# Patient Record
Sex: Male | Born: 1955 | Race: White | Hispanic: No | Marital: Married | Smoking: Former smoker
Health system: Southern US, Community
[De-identification: ages and names within clinical notes are randomized; demographics above are authoritative.]

## PROBLEM LIST (undated history)

## (undated) DIAGNOSIS — C61 Malignant neoplasm of prostate: Secondary | ICD-10-CM

## (undated) DIAGNOSIS — M1990 Unspecified osteoarthritis, unspecified site: Secondary | ICD-10-CM

## (undated) DIAGNOSIS — I10 Essential (primary) hypertension: Secondary | ICD-10-CM

## (undated) DIAGNOSIS — R519 Headache, unspecified: Secondary | ICD-10-CM

## (undated) DIAGNOSIS — E785 Hyperlipidemia, unspecified: Secondary | ICD-10-CM

## (undated) DIAGNOSIS — R51 Headache: Secondary | ICD-10-CM

## (undated) DIAGNOSIS — G4700 Insomnia, unspecified: Secondary | ICD-10-CM

## (undated) HISTORY — PX: EYE SURGERY: SHX253

## (undated) HISTORY — PX: MOUTH SURGERY: SHX715

## (undated) HISTORY — PX: VASECTOMY: SHX75

## (undated) HISTORY — DX: Insomnia, unspecified: G47.00

## (undated) HISTORY — DX: Hyperlipidemia, unspecified: E78.5

## (undated) HISTORY — PX: OTHER SURGICAL HISTORY: SHX169

## (undated) HISTORY — PX: COLONOSCOPY: SHX174

---

## 2008-07-26 ENCOUNTER — Emergency Department: Payer: Self-pay | Admitting: Emergency Medicine

## 2008-07-26 ENCOUNTER — Other Ambulatory Visit: Payer: Self-pay

## 2013-12-12 ENCOUNTER — Ambulatory Visit: Payer: Self-pay | Admitting: Radiation Oncology

## 2014-01-04 ENCOUNTER — Ambulatory Visit: Payer: Self-pay | Admitting: Radiation Oncology

## 2014-01-08 LAB — CBC CANCER CENTER
BASOS PCT: 0.7 %
Basophil #: 0.1 x10 3/mm (ref 0.0–0.1)
Eosinophil #: 0.2 x10 3/mm (ref 0.0–0.7)
Eosinophil %: 2.3 %
HCT: 45.2 % (ref 40.0–52.0)
HGB: 14.7 g/dL (ref 13.0–18.0)
Lymphocyte #: 1.8 x10 3/mm (ref 1.0–3.6)
Lymphocyte %: 21.6 %
MCH: 28.8 pg (ref 26.0–34.0)
MCHC: 32.6 g/dL (ref 32.0–36.0)
MCV: 88 fL (ref 80–100)
MONO ABS: 0.8 x10 3/mm (ref 0.2–1.0)
MONOS PCT: 9.3 %
NEUTROS ABS: 5.6 x10 3/mm (ref 1.4–6.5)
Neutrophil %: 66.1 %
Platelet: 169 x10 3/mm (ref 150–440)
RBC: 5.12 10*6/uL (ref 4.40–5.90)
RDW: 12.6 % (ref 11.5–14.5)
WBC: 8.5 x10 3/mm (ref 3.8–10.6)

## 2014-01-15 LAB — CBC CANCER CENTER
Basophil #: 0 x10 3/mm (ref 0.0–0.1)
Basophil %: 0.4 %
Eosinophil #: 0.3 x10 3/mm (ref 0.0–0.7)
Eosinophil %: 3.1 %
HCT: 43.9 % (ref 40.0–52.0)
HGB: 14.4 g/dL (ref 13.0–18.0)
Lymphocyte #: 1.6 x10 3/mm (ref 1.0–3.6)
Lymphocyte %: 19.6 %
MCH: 28.7 pg (ref 26.0–34.0)
MCHC: 32.7 g/dL (ref 32.0–36.0)
MCV: 88 fL (ref 80–100)
MONO ABS: 0.9 x10 3/mm (ref 0.2–1.0)
Monocyte %: 10.9 %
NEUTROS PCT: 66 %
Neutrophil #: 5.4 x10 3/mm (ref 1.4–6.5)
PLATELETS: 181 x10 3/mm (ref 150–440)
RBC: 5.01 10*6/uL (ref 4.40–5.90)
RDW: 12.5 % (ref 11.5–14.5)
WBC: 8.2 x10 3/mm (ref 3.8–10.6)

## 2014-01-22 LAB — CBC CANCER CENTER
Basophil #: 0 x10 3/mm (ref 0.0–0.1)
Basophil %: 0.5 %
EOS ABS: 0.3 x10 3/mm (ref 0.0–0.7)
Eosinophil %: 3.7 %
HCT: 43.6 % (ref 40.0–52.0)
HGB: 14.3 g/dL (ref 13.0–18.0)
LYMPHS ABS: 1.6 x10 3/mm (ref 1.0–3.6)
Lymphocyte %: 19.5 %
MCH: 28.9 pg (ref 26.0–34.0)
MCHC: 32.8 g/dL (ref 32.0–36.0)
MCV: 88 fL (ref 80–100)
MONO ABS: 0.9 x10 3/mm (ref 0.2–1.0)
Monocyte %: 10.7 %
NEUTROS ABS: 5.3 x10 3/mm (ref 1.4–6.5)
NEUTROS PCT: 65.6 %
PLATELETS: 176 x10 3/mm (ref 150–440)
RBC: 4.95 10*6/uL (ref 4.40–5.90)
RDW: 12.3 % (ref 11.5–14.5)
WBC: 8.1 x10 3/mm (ref 3.8–10.6)

## 2014-01-29 LAB — CBC CANCER CENTER
BASOS ABS: 0 x10 3/mm (ref 0.0–0.1)
Basophil %: 0.5 %
EOS ABS: 0.3 x10 3/mm (ref 0.0–0.7)
Eosinophil %: 3.3 %
HCT: 44.9 % (ref 40.0–52.0)
HGB: 14.6 g/dL (ref 13.0–18.0)
LYMPHS PCT: 17.3 %
Lymphocyte #: 1.4 x10 3/mm (ref 1.0–3.6)
MCH: 28.5 pg (ref 26.0–34.0)
MCHC: 32.5 g/dL (ref 32.0–36.0)
MCV: 88 fL (ref 80–100)
Monocyte #: 0.9 x10 3/mm (ref 0.2–1.0)
Monocyte %: 10.7 %
Neutrophil #: 5.4 x10 3/mm (ref 1.4–6.5)
Neutrophil %: 68.2 %
Platelet: 172 x10 3/mm (ref 150–440)
RBC: 5.12 10*6/uL (ref 4.40–5.90)
RDW: 12.5 % (ref 11.5–14.5)
WBC: 7.9 x10 3/mm (ref 3.8–10.6)

## 2014-02-04 ENCOUNTER — Ambulatory Visit: Payer: Self-pay | Admitting: Radiation Oncology

## 2014-02-05 LAB — CBC CANCER CENTER
Basophil #: 0 x10 3/mm (ref 0.0–0.1)
Basophil %: 0.5 %
EOS ABS: 0.3 x10 3/mm (ref 0.0–0.7)
Eosinophil %: 3.5 %
HCT: 43.6 % (ref 40.0–52.0)
HGB: 14.5 g/dL (ref 13.0–18.0)
LYMPHS ABS: 1.2 x10 3/mm (ref 1.0–3.6)
Lymphocyte %: 15.6 %
MCH: 29.3 pg (ref 26.0–34.0)
MCHC: 33.2 g/dL (ref 32.0–36.0)
MCV: 88 fL (ref 80–100)
Monocyte #: 0.9 x10 3/mm (ref 0.2–1.0)
Monocyte %: 12.6 %
NEUTROS PCT: 67.8 %
Neutrophil #: 5.1 x10 3/mm (ref 1.4–6.5)
Platelet: 170 x10 3/mm (ref 150–440)
RBC: 4.95 10*6/uL (ref 4.40–5.90)
RDW: 12.5 % (ref 11.5–14.5)
WBC: 7.6 x10 3/mm (ref 3.8–10.6)

## 2014-02-12 LAB — CBC CANCER CENTER
Basophil #: 0.1 x10 3/mm (ref 0.0–0.1)
Basophil %: 0.8 %
EOS PCT: 3 %
Eosinophil #: 0.2 x10 3/mm (ref 0.0–0.7)
HCT: 45.1 % (ref 40.0–52.0)
HGB: 14.7 g/dL (ref 13.0–18.0)
LYMPHS ABS: 1 x10 3/mm (ref 1.0–3.6)
Lymphocyte %: 13.8 %
MCH: 29 pg (ref 26.0–34.0)
MCHC: 32.6 g/dL (ref 32.0–36.0)
MCV: 89 fL (ref 80–100)
Monocyte #: 0.9 x10 3/mm (ref 0.2–1.0)
Monocyte %: 12.1 %
Neutrophil #: 5.2 x10 3/mm (ref 1.4–6.5)
Neutrophil %: 70.3 %
Platelet: 171 x10 3/mm (ref 150–440)
RBC: 5.08 10*6/uL (ref 4.40–5.90)
RDW: 12.9 % (ref 11.5–14.5)
WBC: 7.4 x10 3/mm (ref 3.8–10.6)

## 2014-02-19 LAB — CBC CANCER CENTER
Basophil #: 0 x10 3/mm (ref 0.0–0.1)
Basophil %: 0.6 %
EOS PCT: 2.4 %
Eosinophil #: 0.2 x10 3/mm (ref 0.0–0.7)
HCT: 46.1 % (ref 40.0–52.0)
HGB: 14.9 g/dL (ref 13.0–18.0)
LYMPHS ABS: 1.1 x10 3/mm (ref 1.0–3.6)
Lymphocyte %: 12.4 %
MCH: 28.9 pg (ref 26.0–34.0)
MCHC: 32.4 g/dL (ref 32.0–36.0)
MCV: 89 fL (ref 80–100)
Monocyte #: 1.1 x10 3/mm — ABNORMAL HIGH (ref 0.2–1.0)
Monocyte %: 12.7 %
NEUTROS ABS: 6.3 x10 3/mm (ref 1.4–6.5)
Neutrophil %: 71.9 %
PLATELETS: 172 x10 3/mm (ref 150–440)
RBC: 5.17 10*6/uL (ref 4.40–5.90)
RDW: 13.3 % (ref 11.5–14.5)
WBC: 8.7 x10 3/mm (ref 3.8–10.6)

## 2014-03-06 ENCOUNTER — Ambulatory Visit: Payer: Self-pay | Admitting: Radiation Oncology

## 2014-08-04 ENCOUNTER — Ambulatory Visit: Payer: Self-pay | Admitting: Radiation Oncology

## 2014-08-06 ENCOUNTER — Ambulatory Visit: Payer: Self-pay | Admitting: Radiation Oncology

## 2014-08-07 LAB — PSA: PSA: 0.2 ng/mL (ref 0.0–4.0)

## 2014-10-09 ENCOUNTER — Ambulatory Visit: Payer: Self-pay | Admitting: Gastroenterology

## 2015-02-02 ENCOUNTER — Ambulatory Visit
Admit: 2015-02-02 | Disposition: A | Discharge status: Home / Self Care | Payer: Self-pay | Attending: Radiation Oncology | Admitting: Radiation Oncology

## 2015-02-04 LAB — PSA: PSA: 0.2 ng/mL (ref 0.0–4.0)

## 2015-02-05 ENCOUNTER — Ambulatory Visit
Admit: 2015-02-05 | Disposition: A | Discharge status: Home / Self Care | Payer: Self-pay | Attending: Radiation Oncology | Admitting: Radiation Oncology

## 2015-02-10 ENCOUNTER — Ambulatory Visit
Admit: 2015-02-10 | Disposition: A | Discharge status: Home / Self Care | Payer: Self-pay | Attending: Ophthalmology | Admitting: Ophthalmology

## 2015-02-10 HISTORY — PX: CATARACT EXTRACTION: SUR2

## 2015-02-27 NOTE — Consult Note (Signed)
Reason for Visit: This 59 year old Male patient presents to the clinic for initial evaluation of  prostate cancer .   Referred by Dr. Maryan Puls.  Diagnosis:  Chief Complaint/Diagnosis   59 year old male with stage I (T1 C. N0 M0) with a PSA of 4.6 Gleason score 6 (3+3)  Pathology Report pathology report reviewed   Imaging Report no bone scan performed based on low PSA   Referral Report clinical notes reviewed   Planned Treatment Regimen image guidedIMRT radiation   HPI   patient is a pleasant 59 year old male presented with an elevated PSA over the past year. It hovered around the 4.6 range and stayed stable from July to November of this year. Subsequently underwent transrectal ultrasound-guided biopsy showing a single core of the left apex positive for Gleason 6 (3+3) adenocarcinoma. Patient is asymptomatic specifically denies dysuria urinary frequency or urgency. He has nocturia x0. Treatment options have been discussed with the patient and he is leaning towards IMRT treatment which a good friend of his is just completed at [facility].  Past Hx:    Prostate Cancer:    HTN:    Denies surgical history.:   Past, Family and Social History:  Past Medical History positive   Cardiovascular hypertension   Family History positive   Family History Comments family history positive for liver disease and cardiovascular heart disease . Also recently had a cousin with a GBM.   Social History positive   Social History Comments no smoking history social EtOH use history   Additional Past Medical and Surgical History seen by himself today   Allergies:   No Known Allergies:   Home Meds:  Home Medications: Medication Instructions Status  metoprolol 25 mg oral tablet 1  orally 2 times a day  Active  Ambien 5 mg oral tablet 1 tab(s) orally once a day (at bedtime), As Needed - for Inability to Sleep Active  Avodart 0.5 mg oral capsule 1 cap(s) orally once a day Active    Review of Systems:  General negative   Performance Status (ECOG) 0   Skin negative   Breast negative   Ophthalmologic negative   ENMT negative   Respiratory and Thorax negative   Cardiovascular negative   Gastrointestinal negative   Genitourinary negative   Musculoskeletal negative   Neurological negative   Psychiatric negative   Hematology/Lymphatics negative   Endocrine negative   Allergic/Immunologic negative   Review of Systems   review of systems obtained from nurse's notes  Nursing Notes:  Nursing Vital Signs and Chemo Nursing Nursing Notes: *CC Vital Signs Flowsheet:   06-Feb-15 10:55  Temp Temperature 97.3  Pulse Pulse 72  Respirations Respirations 20  SBP SBP 135  DBP DBP 82  Pain Scale (0-10)  0  Current Weight (kg) (kg) 93.1  Height (cm) centimeters 177.8  BSA (m2) 2.1   Physical Exam:  General/Skin/HEENT:  General normal   Skin normal   Eyes normal   ENMT normal   Head and Neck normal   Additional PE well-developed male in NAD. Lungs are clear to A&P cardiac examination shows regular rate and rhythm. Abdomen is benign. On rectal exam rectal sphincter tone is good. Prostate is smooth and contracted slight asymmetry with left lobe being slightly more prominent than the right. Sulcus is preserved bilaterally. No other rectal abnormalities are identified. No discreet nodularity prostate is noted.   Breasts/Resp/CV/GI/GU:  Respiratory and Thorax normal   Cardiovascular normal   Gastrointestinal normal   Genitourinary  normal   MS/Neuro/Psych/Lymph:  Musculoskeletal normal   Neurological normal   Lymphatics normal   Assessment and Plan: Impression:   stage I Gleason 6 adenocarcinoma prostate 58 year old male. Plan:   at this time I have gone over treatment recommendations including radical prostatectomy external beam radiation therapy and I-125 interstitial implant. Patient is well versed in all treatment options and has  elected to proceed with image guided IMRT radiation therapy. Risks and benefits of treatment including increased urinary symptoms including nocturia urgency and frequency, possible diarrhea, possible alteration of blood counts, and fatigue or were discussed in detail with the patient. I've asked Dr. Yves Dill to place gold fiducial your he markers for daily image guided radiation therapy. I set him up for CT simulation shortly after marker placement.  I would like to take this opportunity to thank you for allowing me to continue to participate in this patient's care.  CC Referral:  cc: Dr. Maryan Puls, Dr. Golden Pop   Electronic Signatures: Baruch Gouty Roda Shutters (MD)  (Signed 06-Feb-15 11:58)  Authored: HPI, Diagnosis, Past Hx, PFSH, Allergies, Home Meds, ROS, Nursing Notes, Physical Exam, Encounter Assessment and Plan, CC Referring Physician   Last Updated: 06-Feb-15 11:58 by Armstead Peaks (MD)

## 2015-03-01 LAB — SURGICAL PATHOLOGY

## 2015-03-30 ENCOUNTER — Encounter: Payer: Self-pay | Admitting: *Deleted

## 2015-03-31 NOTE — Anesthesia Preprocedure Evaluation (Addendum)
Anesthesia Evaluation  Patient identified by MRN, date of birth, ID band  Reviewed: Allergy & Precautions, H&P , NPO status , Patient's Chart, lab work & pertinent test results  Airway Mallampati: I  TM Distance: >3 FB Neck ROM: full    Dental no notable dental hx.    Pulmonary    Pulmonary exam normal       Cardiovascular hypertension, Rhythm:regular Rate:Normal     Neuro/Psych    GI/Hepatic   Endo/Other    Renal/GU      Musculoskeletal   Abdominal   Peds  Hematology   Anesthesia Other Findings   Reproductive/Obstetrics                            Anesthesia Physical Anesthesia Plan  ASA: II  Anesthesia Plan: MAC   Post-op Pain Management:    Induction:   Airway Management Planned:   Additional Equipment:   Intra-op Plan:   Post-operative Plan:   Informed Consent: I have reviewed the patients History and Physical, chart, labs and discussed the procedure including the risks, benefits and alternatives for the proposed anesthesia with the patient or authorized representative who has indicated his/her understanding and acceptance.     Plan Discussed with: CRNA  Anesthesia Plan Comments:         Anesthesia Quick Evaluation

## 2015-04-06 NOTE — Discharge Instructions (Signed)

## 2015-04-07 ENCOUNTER — Ambulatory Visit: Payer: BLUE CROSS/BLUE SHIELD | Admitting: Anesthesiology

## 2015-04-07 ENCOUNTER — Encounter
Admission: RE | Disposition: A | Discharge status: Home / Self Care | Payer: Self-pay | Source: Ambulatory Visit | Attending: Ophthalmology

## 2015-04-07 ENCOUNTER — Ambulatory Visit
Admission: RE | Admit: 2015-04-07 | Discharge: 2015-04-07 | Disposition: A | Discharge status: Home / Self Care | Payer: BLUE CROSS/BLUE SHIELD | Source: Ambulatory Visit | Attending: Ophthalmology | Admitting: Ophthalmology

## 2015-04-07 DIAGNOSIS — H25041 Posterior subcapsular polar age-related cataract, right eye: Secondary | ICD-10-CM | POA: Insufficient documentation

## 2015-04-07 DIAGNOSIS — Z79899 Other long term (current) drug therapy: Secondary | ICD-10-CM | POA: Insufficient documentation

## 2015-04-07 DIAGNOSIS — I10 Essential (primary) hypertension: Secondary | ICD-10-CM | POA: Insufficient documentation

## 2015-04-07 DIAGNOSIS — Z8546 Personal history of malignant neoplasm of prostate: Secondary | ICD-10-CM | POA: Diagnosis not present

## 2015-04-07 DIAGNOSIS — M1990 Unspecified osteoarthritis, unspecified site: Secondary | ICD-10-CM | POA: Diagnosis not present

## 2015-04-07 HISTORY — DX: Unspecified osteoarthritis, unspecified site: M19.90

## 2015-04-07 HISTORY — DX: Headache: R51

## 2015-04-07 HISTORY — DX: Malignant neoplasm of prostate: C61

## 2015-04-07 HISTORY — DX: Essential (primary) hypertension: I10

## 2015-04-07 HISTORY — DX: Headache, unspecified: R51.9

## 2015-04-07 HISTORY — PX: CATARACT EXTRACTION W/PHACO: SHX586

## 2015-04-07 SURGERY — PHACOEMULSIFICATION, CATARACT, WITH IOL INSERTION
Anesthesia: Monitor Anesthesia Care | Laterality: Right | Wound class: Clean

## 2015-04-07 MED ORDER — MIDAZOLAM HCL 2 MG/2ML IJ SOLN
INTRAMUSCULAR | Status: DC | PRN
  Administered 2015-04-07 (×2): 1 mg via INTRAVENOUS

## 2015-04-07 MED ORDER — ACETAMINOPHEN 160 MG/5ML PO SOLN: 325.0000 mg | ORAL | Status: DC | PRN

## 2015-04-07 MED ORDER — TIMOLOL MALEATE 0.5 % OP SOLN
OPHTHALMIC | Status: DC | PRN
  Administered 2015-04-07 (×2): 1 [drp] via OPHTHALMIC

## 2015-04-07 MED ORDER — LIDOCAINE HCL (PF) 4 % IJ SOLN
INTRAMUSCULAR | Status: DC | PRN
  Administered 2015-04-07: 1 mL

## 2015-04-07 MED ORDER — NA HYALUR & NA CHOND-NA HYALUR 0.4-0.35 ML IO KIT
PACK | INTRAOCULAR | Status: DC | PRN
  Administered 2015-04-07: 1 mL via INTRAOCULAR

## 2015-04-07 MED ORDER — ACETAMINOPHEN 325 MG PO TABS: 325.0000 mg | ORAL_TABLET | ORAL | Status: DC | PRN

## 2015-04-07 MED ORDER — CEFUROXIME OPHTHALMIC INJECTION 1 MG/0.1 ML
INJECTION | OPHTHALMIC | Status: DC | PRN
  Administered 2015-04-07: 0.1 mL via INTRACAMERAL

## 2015-04-07 MED ORDER — BRIMONIDINE TARTRATE 0.2 % OP SOLN
OPHTHALMIC | Status: DC | PRN
  Administered 2015-04-07: 1 [drp] via OPHTHALMIC

## 2015-04-07 MED ORDER — POVIDONE-IODINE 5 % OP SOLN
1.0000 "application " | OPHTHALMIC | Status: DC | PRN
  Administered 2015-04-07: 1 via OPHTHALMIC

## 2015-04-07 MED ORDER — TETRACAINE HCL 0.5 % OP SOLN
1.0000 [drp] | OPHTHALMIC | Status: DC | PRN
  Administered 2015-04-07: 1 [drp] via OPHTHALMIC

## 2015-04-07 MED ORDER — EPINEPHRINE HCL 1 MG/ML IJ SOLN
INTRAMUSCULAR | Status: DC | PRN
  Administered 2015-04-07: 1 mg

## 2015-04-07 MED ORDER — FENTANYL CITRATE (PF) 100 MCG/2ML IJ SOLN
INTRAMUSCULAR | Status: DC | PRN
  Administered 2015-04-07 (×2): 50 ug via INTRAVENOUS

## 2015-04-07 MED ORDER — ARMC OPHTHALMIC DILATING GEL
1.0000 "application " | OPHTHALMIC | Status: DC | PRN
  Administered 2015-04-07: 1 via OPHTHALMIC

## 2015-04-07 SURGICAL SUPPLY — 26 items
CANNULA ANT/CHMB 27GA (MISCELLANEOUS) ×2 IMPLANT
GLOVE SURG LX 7.5 STRW (GLOVE) ×1
GLOVE SURG LX STRL 7.5 STRW (GLOVE) ×1 IMPLANT
GLOVE SURG TRIUMPH 8.0 PF LTX (GLOVE) ×2 IMPLANT
GOWN STRL REUS W/ TWL LRG LVL3 (GOWN DISPOSABLE) ×2 IMPLANT
GOWN STRL REUS W/TWL LRG LVL3 (GOWN DISPOSABLE) ×2
LENS IOL TECNIS 21.5 (Intraocular Lens) ×2 IMPLANT
LENS IOL TECNIS MONO 1P 21.5 (Intraocular Lens) ×1 IMPLANT
MARKER SKIN SURG W/RULER VIO (MISCELLANEOUS) ×2 IMPLANT
NDL RETROBULBAR .5 NSTRL (NEEDLE) IMPLANT
NEEDLE FILTER BLUNT 18X 1/2SAF (NEEDLE) ×1
NEEDLE FILTER BLUNT 18X1 1/2 (NEEDLE) ×1 IMPLANT
PACK CATARACT BRASINGTON (MISCELLANEOUS) ×2 IMPLANT
PACK EYE AFTER SURG (MISCELLANEOUS) ×2 IMPLANT
PACK OPTHALMIC (MISCELLANEOUS) ×2 IMPLANT
RING MALYGIN 7.0 (MISCELLANEOUS) IMPLANT
SUT ETHILON 10-0 CS-B-6CS-B-6 (SUTURE)
SUT VICRYL  9 0 (SUTURE)
SUT VICRYL 9 0 (SUTURE) IMPLANT
SUTURE EHLN 10-0 CS-B-6CS-B-6 (SUTURE) IMPLANT
SYR 3ML LL SCALE MARK (SYRINGE) ×2 IMPLANT
SYR 5ML LL (SYRINGE) IMPLANT
SYR TB 1ML LUER SLIP (SYRINGE) ×2 IMPLANT
WATER STERILE IRR 250ML POUR (IV SOLUTION) ×2 IMPLANT
WATER STERILE IRR 500ML POUR (IV SOLUTION) IMPLANT
WIPE NON LINTING 3.25X3.25 (MISCELLANEOUS) ×2 IMPLANT

## 2015-04-07 NOTE — H&P (Signed)
   The History and Physical notes were scanned in.  The patient remains stable and unchanged from the H&P.   Previous H&P reviewed, patient examined, and there are no changes.  Austin Wall 04/07/2015 9:30 AM

## 2015-04-07 NOTE — Transfer of Care (Signed)
Immediate Anesthesia Transfer of Care Note  Patient: Austin Wall  Procedure(s) Performed: Procedure(s) with comments: CATARACT EXTRACTION PHACO AND INTRAOCULAR LENS PLACEMENT ([place]) (Right) - [place]  Patient Location: PACU  Anesthesia Type: MAC  Level of Consciousness: awake, alert  and patient cooperative  Airway and Oxygen Therapy: Patient Spontanous Breathing and Patient connected to supplemental oxygen  Post-op Assessment: Post-op Vital signs reviewed, Patient's Cardiovascular Status Stable, Respiratory Function Stable, Patent Airway and No signs of Nausea or vomiting  Post-op Vital Signs: Reviewed and stable  Complications: No apparent anesthesia complications

## 2015-04-07 NOTE — Anesthesia Postprocedure Evaluation (Signed)
  Anesthesia Post-op Note  Patient: Austin Wall  Procedure(s) Performed: Procedure(s) with comments: CATARACT EXTRACTION PHACO AND INTRAOCULAR LENS PLACEMENT ([place]) (Right) - [place]  Anesthesia type:MAC  Patient location: PACU  Post pain: Pain level controlled  Post assessment: Post-op Vital signs reviewed, Patient's Cardiovascular Status Stable, Respiratory Function Stable, Patent Airway and No signs of Nausea or vomiting  Post vital signs: Reviewed and stable  Last Vitals:  Filed Vitals:   04/07/15 1034  BP: 148/97  Pulse: 71  Temp: 36.7 C  Resp: 20    Level of consciousness: awake, alert  and patient cooperative  Complications: No apparent anesthesia complications

## 2015-04-07 NOTE — Anesthesia Procedure Notes (Signed)
Procedure Name: MAC Performed by: Duvid Smalls Pre-anesthesia Checklist: Patient identified, Emergency Drugs available, Suction available, Timeout performed and Patient being monitored Patient Re-evaluated:Patient Re-evaluated prior to inductionOxygen Delivery Method: Nasal cannula Placement Confirmation: positive ETCO2     

## 2015-04-07 NOTE — Op Note (Signed)
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 Item Serial No. Manufacturer Lot No. LRB No. Used  LENS IMPL INTRAOC ZCB00 21.5 - [id number] Intraocular Lens LENS IMPL INTRAOC ZCB00 21.5 [id number]   Right 1        ULTRASOUND TIME: 14 % of 0 minutes, 41 seconds.  CDE 5.9   SURGEON:  Wyonia Hough, MD   ANESTHESIA: Topical with tetracaine drops and 2% Xylocaine jelly, augmented with 1% preservative-free intracameral lidocaine.    COMPLICATIONS:  None.   DESCRIPTION OF PROCEDURE:  The patient was identified in the holding room and transported to the operating room and placed in the supine position under the operating microscope.  The right eye was identified as the operative eye and it was prepped and draped in the usual sterile ophthalmic fashion.   A 1 millimeter clear-corneal paracentesis was made at the 12:00 position.  0.5 ml of preservative-free 1% lidocaine was injected into the anterior chamber. The anterior chamber was filled with Viscoat viscoelastic.  A 2.4 millimeter keratome was used to make a near-clear corneal incision at the 9:00 position.  A curvilinear capsulorrhexis was made with a cystotome and capsulorrhexis forceps.  Balanced salt solution was used to hydrodissect and hydrodelineate the nucleus.   Phacoemulsification was then used in stop and chop fashion to remove the lens nucleus and epinucleus.  The remaining cortex was then removed using the irrigation and aspiration handpiece. Provisc was then placed into the capsular bag to distend it for lens placement.  A lens was then injected into the capsular bag.  The remaining viscoelastic was aspirated.   Wounds were hydrated with  balanced salt solution.  The anterior chamber was inflated to a physiologic pressure with balanced salt solution.  No wound leaks were noted. Cefuroxime 0.1 ml of a 10mg /ml solution was injected into the anterior chamber for a dose of 1 mg of intracameral antibiotic at the completion of the case.   Timolol and Brimonidine drops were applied to the eye.  The patient was taken to the recovery room in stable condition without complications of anesthesia or surgery.   Zeena Starkel 04/07/2015, 10:26 AM

## 2015-04-08 ENCOUNTER — Encounter: Payer: Self-pay | Admitting: Ophthalmology

## 2015-04-21 ENCOUNTER — Other Ambulatory Visit: Payer: Self-pay | Admitting: Family Medicine

## 2015-04-21 NOTE — Telephone Encounter (Signed)
rx to call in

## 2015-05-24 ENCOUNTER — Other Ambulatory Visit: Payer: Self-pay | Admitting: Family Medicine

## 2015-06-21 ENCOUNTER — Other Ambulatory Visit: Payer: Self-pay | Admitting: Family Medicine

## 2015-06-23 DIAGNOSIS — E785 Hyperlipidemia, unspecified: Secondary | ICD-10-CM | POA: Insufficient documentation

## 2015-06-24 ENCOUNTER — Encounter: Payer: Self-pay | Admitting: Family Medicine

## 2015-06-24 ENCOUNTER — Ambulatory Visit (INDEPENDENT_AMBULATORY_CARE_PROVIDER_SITE_OTHER): Payer: BLUE CROSS/BLUE SHIELD | Admitting: Family Medicine

## 2015-06-24 VITALS — BP 128/78 | Temp 98.1°F | Ht 68.3 in | Wt 199.0 lb

## 2015-06-24 DIAGNOSIS — C61 Malignant neoplasm of prostate: Secondary | ICD-10-CM | POA: Diagnosis not present

## 2015-06-24 DIAGNOSIS — Z0000 Encounter for general adult medical examination without abnormal findings: Secondary | ICD-10-CM | POA: Diagnosis not present

## 2015-06-24 DIAGNOSIS — G4700 Insomnia, unspecified: Secondary | ICD-10-CM | POA: Diagnosis not present

## 2015-06-24 DIAGNOSIS — I10 Essential (primary) hypertension: Secondary | ICD-10-CM | POA: Diagnosis not present

## 2015-06-24 LAB — URINALYSIS, ROUTINE W REFLEX MICROSCOPIC
Bilirubin, UA: NEGATIVE
Glucose, UA: NEGATIVE
Ketones, UA: NEGATIVE
LEUKOCYTES UA: NEGATIVE
NITRITE UA: NEGATIVE
PH UA: 5 (ref 5.0–7.5)
Protein, UA: NEGATIVE
RBC, UA: NEGATIVE
Specific Gravity, UA: 1.03 (ref 1.005–1.030)
Urobilinogen, Ur: 0.2 mg/dL (ref 0.2–1.0)

## 2015-06-24 MED ORDER — AMLODIPINE BESYLATE 5 MG PO TABS: 5.0000 mg | ORAL_TABLET | Freq: Every day | ORAL | Status: DC

## 2015-06-24 MED ORDER — ZALEPLON 10 MG PO CAPS: 10.0000 mg | ORAL_CAPSULE | Freq: Every evening | ORAL | Status: DC | PRN

## 2015-06-24 MED ORDER — LORAZEPAM 1 MG PO TABS: 1.0000 mg | ORAL_TABLET | Freq: Every day | ORAL | Status: DC | PRN

## 2015-06-24 MED ORDER — BENAZEPRIL HCL 40 MG PO TABS: 40.0000 mg | ORAL_TABLET | Freq: Every day | ORAL | Status: DC

## 2015-06-24 NOTE — Progress Notes (Signed)
BP 128/78 mmHg  Temp(Src) 98.1 F (36.7 C)  Ht 5' 8.3" (1.735 m)  Wt 199 lb (90.266 kg)  BMI 29.99 kg/m2  SpO2 99%   Subjective:    Patient ID: Austin Wall, male    DOB: 1956-02-18, 59 y.o.   MRN: [id number]  HPI: PANAGIOTIS OELKERS is a 59 y.o. male  Chief Complaint  Patient presents with  . Annual Exam   patient follow-up hypertension doing well no complaints medications no side effects For chronic insomnia patient using Sonata 10 mg every night without it does not sleep at all this is been ongoing for all his adult life. Takes lorazepam on a when necessary basis when not getting any sleep with Sonata.  Relevant past medical, surgical, family and social history reviewed and updated as indicated. Interim medical history since our last visit reviewed. Allergies and medications reviewed and updated.  Review of Systems  Constitutional: Negative.   HENT: Negative.   Eyes: Negative.   Respiratory: Negative.   Cardiovascular: Negative.   Endocrine: Negative.   Musculoskeletal: Negative.   Skin: Negative.   Allergic/Immunologic: Negative.   Neurological: Negative.   Hematological: Negative.   Psychiatric/Behavioral: Negative.     Per HPI unless specifically indicated above     Objective:    BP 128/78 mmHg  Temp(Src) 98.1 F (36.7 C)  Ht 5' 8.3" (1.735 m)  Wt 199 lb (90.266 kg)  BMI 29.99 kg/m2  SpO2 99%  Wt Readings from Last 3 Encounters:  06/24/15 199 lb (90.266 kg)  12/24/14 202 lb (91.627 kg)  04/07/15 204 lb (92.534 kg)    Physical Exam  Constitutional: He is oriented to person, place, and time. He appears well-developed and well-nourished.  HENT:  Head: Normocephalic and atraumatic.  Right Ear: External ear normal.  Left Ear: External ear normal.  Eyes: Conjunctivae and EOM are normal. Pupils are equal, round, and reactive to light.  Neck: Normal range of motion. Neck supple.  Cardiovascular: Normal rate, regular rhythm, normal heart sounds and  intact distal pulses.   Pulmonary/Chest: Effort normal and breath sounds normal.  Abdominal: Soft. Bowel sounds are normal. There is no splenomegaly or hepatomegaly.  Genitourinary: Rectum normal, prostate normal and penis normal.  Musculoskeletal: Normal range of motion.  Neurological: He is alert and oriented to person, place, and time. He has normal reflexes.  Skin: No rash noted. No erythema.  Psychiatric: He has a normal mood and affect. His behavior is normal. Judgment and thought content normal.    Results for orders placed or performed during the hospital encounter of 02/02/15  PSA  Result Value Ref Range   PSA 0.2 0.0-4.0 ng/mL      Assessment & Plan:   Problem List Items Addressed This Visit      Cardiovascular and Mediastinum   Hypertension    The current medical regimen is effective;  continue present plan and medications.       Relevant Medications   amLODipine (NORVASC) 5 MG tablet   benazepril (LOTENSIN) 40 MG tablet     Genitourinary   Prostate ca - Primary   Relevant Medications   LORazepam (ATIVAN) 1 MG tablet     Other   Insomnia    Discussed insomnia care and tx will cont meds as stable      Relevant Medications   LORazepam (ATIVAN) 1 MG tablet   zaleplon (SONATA) 10 MG capsule    Other Visit Diagnoses    PE (physical exam), annual  Relevant Orders    Comprehensive metabolic panel    Lipid panel    CBC with Differential/Platelet    TSH    Urinalysis, Routine w reflex microscopic (not at [facility])        Follow up plan: Return in about 6 months (around 12/25/2015), or if symptoms worsen or fail to improve, for recheck insonmia.

## 2015-06-24 NOTE — Assessment & Plan Note (Signed)
The current medical regimen is effective;  continue present plan and medications.  

## 2015-06-24 NOTE — Assessment & Plan Note (Signed)
Discussed insomnia care and tx will cont meds as stable

## 2015-06-25 LAB — CBC WITH DIFFERENTIAL/PLATELET
BASOS ABS: 0 10*3/uL (ref 0.0–0.2)
Basos: 0 %
EOS (ABSOLUTE): 0.1 10*3/uL (ref 0.0–0.4)
EOS: 2 %
HEMATOCRIT: 42.6 % (ref 37.5–51.0)
HEMOGLOBIN: 14 g/dL (ref 12.6–17.7)
Immature Grans (Abs): 0 10*3/uL (ref 0.0–0.1)
Immature Granulocytes: 0 %
LYMPHS ABS: 1.8 10*3/uL (ref 0.7–3.1)
Lymphs: 24 %
MCH: 29.3 pg (ref 26.6–33.0)
MCHC: 32.9 g/dL (ref 31.5–35.7)
MCV: 89 fL (ref 79–97)
MONOCYTES: 8 %
Monocytes Absolute: 0.6 10*3/uL (ref 0.1–0.9)
Neutrophils Absolute: 4.8 10*3/uL (ref 1.4–7.0)
Neutrophils: 66 %
Platelets: 196 10*3/uL (ref 150–379)
RBC: 4.78 x10E6/uL (ref 4.14–5.80)
RDW: 12.5 % (ref 12.3–15.4)
WBC: 7.3 10*3/uL (ref 3.4–10.8)

## 2015-06-25 LAB — COMPREHENSIVE METABOLIC PANEL
A/G RATIO: 2 (ref 1.1–2.5)
ALT: 64 IU/L — AB (ref 0–44)
AST: 44 IU/L — AB (ref 0–40)
Albumin: 4.7 g/dL (ref 3.5–5.5)
Alkaline Phosphatase: 63 IU/L (ref 39–117)
BUN/Creatinine Ratio: 22 — ABNORMAL HIGH (ref 9–20)
BUN: 19 mg/dL (ref 6–24)
Bilirubin Total: 0.4 mg/dL (ref 0.0–1.2)
CALCIUM: 9.7 mg/dL (ref 8.7–10.2)
CO2: 24 mmol/L (ref 18–29)
Chloride: 101 mmol/L (ref 97–108)
Creatinine, Ser: 0.87 mg/dL (ref 0.76–1.27)
GFR calc Af Amer: 111 mL/min/{1.73_m2} (ref >59)
GFR, EST NON AFRICAN AMERICAN: 96 mL/min/{1.73_m2} (ref >59)
GLUCOSE: 99 mg/dL (ref 65–99)
Globulin, Total: 2.3 g/dL (ref 1.5–4.5)
POTASSIUM: 4.3 mmol/L (ref 3.5–5.2)
Sodium: 139 mmol/L (ref 134–144)
Total Protein: 7 g/dL (ref 6.0–8.5)

## 2015-06-25 LAB — LIPID PANEL
CHOL/HDL RATIO: 4.9 ratio (ref 0.0–5.0)
Cholesterol, Total: 212 mg/dL — ABNORMAL HIGH (ref 100–199)
HDL: 43 mg/dL (ref >39)
LDL Calculated: 133 mg/dL — ABNORMAL HIGH (ref 0–99)
Triglycerides: 179 mg/dL — ABNORMAL HIGH (ref 0–149)
VLDL Cholesterol Cal: 36 mg/dL (ref 5–40)

## 2015-06-25 LAB — TSH: TSH: 1.62 u[IU]/mL (ref 0.450–4.500)

## 2015-06-28 ENCOUNTER — Encounter: Payer: Self-pay | Admitting: Family Medicine

## 2015-08-19 ENCOUNTER — Ambulatory Visit: Payer: BLUE CROSS/BLUE SHIELD

## 2015-12-17 ENCOUNTER — Other Ambulatory Visit: Payer: Self-pay | Admitting: Family Medicine

## 2015-12-20 ENCOUNTER — Other Ambulatory Visit: Payer: Self-pay | Admitting: Family Medicine

## 2015-12-28 ENCOUNTER — Encounter: Payer: Self-pay | Admitting: Family Medicine

## 2015-12-28 ENCOUNTER — Ambulatory Visit (INDEPENDENT_AMBULATORY_CARE_PROVIDER_SITE_OTHER): Payer: BLUE CROSS/BLUE SHIELD | Admitting: Family Medicine

## 2015-12-28 VITALS — BP 137/81 | HR 71 | Temp 98.1°F | Ht 69.1 in | Wt 199.0 lb

## 2015-12-28 DIAGNOSIS — I10 Essential (primary) hypertension: Secondary | ICD-10-CM | POA: Diagnosis not present

## 2015-12-28 DIAGNOSIS — G4700 Insomnia, unspecified: Secondary | ICD-10-CM | POA: Diagnosis not present

## 2015-12-28 MED ORDER — ZALEPLON 10 MG PO CAPS: 10.0000 mg | ORAL_CAPSULE | Freq: Every evening | ORAL | Status: DC | PRN

## 2015-12-28 NOTE — Assessment & Plan Note (Signed)
The current medical regimen is effective;  continue present plan and medications.  

## 2015-12-28 NOTE — Progress Notes (Signed)
BP 137/81 mmHg  Pulse 71  Temp(Src) 98.1 F (36.7 C)  Ht 5' 9.1" (1.755 m)  Wt 199 lb (90.266 kg)  BMI 29.31 kg/m2  SpO2 98%   Subjective:    Patient ID: Austin Wall, male    DOB: 26-Aug-1956, 60 y.o.   MRN: [id number]  HPI: Austin Wall is a 60 y.o. male  Chief Complaint  Patient presents with  . Insomnia   patient follow-up does well with [employer] takes every night has taken this long-term without problems. Takes occasional lorazepam for anxiety. Blood pressure does well with no complaints takes medicines faithfully without problems and good control  Relevant past medical, surgical, family and social history reviewed and updated as indicated. Interim medical history since our last visit reviewed. Allergies and medications reviewed and updated.  Review of Systems  Constitutional: Negative.   Respiratory: Negative.   Cardiovascular: Negative.     Per HPI unless specifically indicated above     Objective:    BP 137/81 mmHg  Pulse 71  Temp(Src) 98.1 F (36.7 C)  Ht 5' 9.1" (1.755 m)  Wt 199 lb (90.266 kg)  BMI 29.31 kg/m2  SpO2 98%  Wt Readings from Last 3 Encounters:  12/28/15 199 lb (90.266 kg)  06/24/15 199 lb (90.266 kg)  12/24/14 202 lb (91.627 kg)    Physical Exam  Constitutional: He is oriented to person, place, and time. He appears well-developed and well-nourished. No distress.  HENT:  Head: Normocephalic and atraumatic.  Right Ear: Hearing normal.  Left Ear: Hearing normal.  Nose: Nose normal.  Eyes: Conjunctivae and lids are normal. Right eye exhibits no discharge. Left eye exhibits no discharge. No scleral icterus.  Cardiovascular: Normal rate, regular rhythm and normal heart sounds.   Pulmonary/Chest: Effort normal and breath sounds normal. No respiratory distress.  Musculoskeletal: Normal range of motion.  Neurological: He is alert and oriented to person, place, and time.  Skin: Skin is intact. No rash noted.  Psychiatric: He has a  normal mood and affect. His speech is normal and behavior is normal. Judgment and thought content normal. Cognition and memory are normal.    Results for orders placed or performed in visit on 06/24/15  Comprehensive metabolic panel  Result Value Ref Range   Glucose 99 65 - 99 mg/dL   BUN 19 6 - 24 mg/dL   Creatinine, Ser 0.87 0.76 - 1.27 mg/dL   GFR calc non Af Amer 96 >59 mL/min/1.73   GFR calc Af Amer 111 >59 mL/min/1.73   BUN/Creatinine Ratio 22 (H) 9 - 20   Sodium 139 134 - 144 mmol/L   Potassium 4.3 3.5 - 5.2 mmol/L   Chloride 101 97 - 108 mmol/L   CO2 24 18 - 29 mmol/L   Calcium 9.7 8.7 - 10.2 mg/dL   Total Protein 7.0 6.0 - 8.5 g/dL   Albumin 4.7 3.5 - 5.5 g/dL   Globulin, Total 2.3 1.5 - 4.5 g/dL   Albumin/Globulin Ratio 2.0 1.1 - 2.5   Bilirubin Total 0.4 0.0 - 1.2 mg/dL   Alkaline Phosphatase 63 39 - 117 IU/L   AST 44 (H) 0 - 40 IU/L   ALT 64 (H) 0 - 44 IU/L  Lipid panel  Result Value Ref Range   Cholesterol, Total 212 (H) 100 - 199 mg/dL   Triglycerides 179 (H) 0 - 149 mg/dL   HDL 43 >39 mg/dL   VLDL Cholesterol Cal 36 5 - 40 mg/dL   LDL Calculated  133 (H) 0 - 99 mg/dL   Chol/HDL Ratio 4.9 0.0 - 5.0 ratio units  CBC with Differential/Platelet  Result Value Ref Range   WBC 7.3 3.4 - 10.8 x10E3/uL   RBC 4.78 4.14 - 5.80 x10E6/uL   Hemoglobin 14.0 12.6 - 17.7 g/dL   Hematocrit 42.6 37.5 - 51.0 %   MCV 89 79 - 97 fL   MCH 29.3 26.6 - 33.0 pg   MCHC 32.9 31.5 - 35.7 g/dL   RDW 12.5 12.3 - 15.4 %   Platelets 196 150 - 379 x10E3/uL   Neutrophils 66 %   Lymphs 24 %   Monocytes 8 %   Eos 2 %   Basos 0 %   Neutrophils Absolute 4.8 1.4 - 7.0 x10E3/uL   Lymphocytes Absolute 1.8 0.7 - 3.1 x10E3/uL   Monocytes Absolute 0.6 0.1 - 0.9 x10E3/uL   EOS (ABSOLUTE) 0.1 0.0 - 0.4 x10E3/uL   Basophils Absolute 0.0 0.0 - 0.2 x10E3/uL   Immature Granulocytes 0 %   Immature Grans (Abs) 0.0 0.0 - 0.1 x10E3/uL  TSH  Result Value Ref Range   TSH 1.620 0.450 - 4.500 uIU/mL   Urinalysis, Routine w reflex microscopic (not at [facility])  Result Value Ref Range   Specific Gravity, UA 1.030 1.005 - 1.030   pH, UA 5.0 5.0 - 7.5   Color, UA Yellow Yellow   Appearance Ur Clear Clear   Leukocytes, UA Negative Negative   Protein, UA Negative Negative/Trace   Glucose, UA Negative Negative   Ketones, UA Negative Negative   RBC, UA Negative Negative   Bilirubin, UA Negative Negative   Urobilinogen, Ur 0.2 0.2 - 1.0 mg/dL   Nitrite, UA Negative Negative      Assessment & Plan:   Problem List Items Addressed This Visit      Cardiovascular and Mediastinum   Hypertension    The current medical regimen is effective;  continue present plan and medications.         Other   Insomnia - Primary    The current medical regimen is effective;  continue present plan and medications.       Relevant Medications   zaleplon (SONATA) 10 MG capsule       Follow up plan: Return in about 6 months (around 06/26/2016) for Physical Exam.

## 2016-01-14 ENCOUNTER — Other Ambulatory Visit: Payer: Self-pay | Admitting: Family Medicine

## 2016-02-03 ENCOUNTER — Other Ambulatory Visit: Payer: Self-pay | Admitting: *Deleted

## 2016-02-03 DIAGNOSIS — C61 Malignant neoplasm of prostate: Secondary | ICD-10-CM

## 2016-02-07 ENCOUNTER — Ambulatory Visit: Payer: BLUE CROSS/BLUE SHIELD | Attending: Radiation Oncology | Admitting: Radiation Oncology

## 2016-02-07 ENCOUNTER — Inpatient Hospital Stay: Payer: BLUE CROSS/BLUE SHIELD | Attending: Radiation Oncology

## 2016-03-13 ENCOUNTER — Other Ambulatory Visit: Payer: Self-pay | Admitting: Family Medicine

## 2016-03-13 NOTE — Telephone Encounter (Signed)
Fax rx

## 2016-05-30 ENCOUNTER — Encounter (INDEPENDENT_AMBULATORY_CARE_PROVIDER_SITE_OTHER): Payer: Self-pay

## 2016-06-27 ENCOUNTER — Encounter: Payer: Self-pay | Admitting: Family Medicine

## 2016-06-27 ENCOUNTER — Ambulatory Visit (INDEPENDENT_AMBULATORY_CARE_PROVIDER_SITE_OTHER): Payer: BLUE CROSS/BLUE SHIELD | Admitting: Family Medicine

## 2016-06-27 VITALS — BP 134/75 | HR 72 | Temp 97.7°F | Ht 69.3 in | Wt 201.0 lb

## 2016-06-27 DIAGNOSIS — G4700 Insomnia, unspecified: Secondary | ICD-10-CM

## 2016-06-27 DIAGNOSIS — Z0000 Encounter for general adult medical examination without abnormal findings: Secondary | ICD-10-CM

## 2016-06-27 DIAGNOSIS — I10 Essential (primary) hypertension: Secondary | ICD-10-CM | POA: Diagnosis not present

## 2016-06-27 LAB — URINALYSIS, ROUTINE W REFLEX MICROSCOPIC
BILIRUBIN UA: NEGATIVE
GLUCOSE, UA: NEGATIVE
Ketones, UA: NEGATIVE
Leukocytes, UA: NEGATIVE
Nitrite, UA: NEGATIVE
PROTEIN UA: NEGATIVE
Specific Gravity, UA: 1.02 (ref 1.005–1.030)
Urobilinogen, Ur: 1 mg/dL (ref 0.2–1.0)
pH, UA: 6 (ref 5.0–7.5)

## 2016-06-27 LAB — MICROSCOPIC EXAMINATION
Epithelial Cells (non renal): NONE SEEN /hpf (ref 0–10)
WBC, UA: NONE SEEN /hpf (ref 0–?)

## 2016-06-27 MED ORDER — LORAZEPAM 1 MG PO TABS: 1.0000 mg | ORAL_TABLET | Freq: Every day | ORAL | 1 refills | Status: DC | PRN

## 2016-06-27 MED ORDER — ZALEPLON 10 MG PO CAPS: 10.0000 mg | ORAL_CAPSULE | Freq: Every evening | ORAL | 5 refills | Status: DC | PRN

## 2016-06-27 MED ORDER — AMLODIPINE BESYLATE 5 MG PO TABS: 5.0000 mg | ORAL_TABLET | Freq: Every day | ORAL | 12 refills | Status: DC

## 2016-06-27 MED ORDER — BENAZEPRIL HCL 40 MG PO TABS: 40.0000 mg | ORAL_TABLET | Freq: Every day | ORAL | 12 refills | Status: DC

## 2016-06-27 NOTE — Assessment & Plan Note (Signed)
The current medical regimen is effective;  continue present plan and medications.  

## 2016-06-27 NOTE — Progress Notes (Signed)
BP 134/75 (BP Location: Left Arm, Patient Position: Sitting, Cuff Size: Normal)   Pulse 72   Temp 97.7 F (36.5 C)   Ht 5' 9.3" (1.76 m)   Wt 201 lb (91.2 kg)   SpO2 97%   BMI 29.43 kg/m    Subjective:    Patient ID: Austin Wall, male    DOB: Jan 17, 1956, 60 y.o.   MRN: [id number]  HPI: DAJOUN SWANGER is a 60 y.o. male  Chief Complaint  Patient presents with  . 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 Which would relieve a lot of stress but in the meantime has been taking a little more lorazepam continues to use Sonata every night. Sleeps okay in stress manage with occasional lorazepam use  Relevant past medical, surgical, family and social history reviewed and updated as indicated. Interim medical history since our last visit reviewed. Allergies and medications reviewed and updated.  Review of Systems  Constitutional: Negative.   HENT: Negative.   Eyes: Negative.   Respiratory: Negative.   Cardiovascular: Negative.   Gastrointestinal: Negative.   Endocrine: Negative.   Genitourinary: Negative.   Musculoskeletal: Negative.   Skin: Negative.   Allergic/Immunologic: Negative.   Neurological: Negative.   Hematological: Negative.   Psychiatric/Behavioral: Negative.     Per HPI unless specifically indicated above     Objective:    BP 134/75 (BP Location: Left Arm, Patient Position: Sitting, Cuff Size: Normal)   Pulse 72   Temp 97.7 F (36.5 C)   Ht 5' 9.3" (1.76 m)   Wt 201 lb (91.2 kg)   SpO2 97%   BMI 29.43 kg/m   Wt Readings from Last 3 Encounters:  06/27/16 201 lb (91.2 kg)  12/28/15 199 lb (90.3 kg)  06/24/15 199 lb (90.3 kg)    Physical Exam  Constitutional: He is oriented to person, place, and time. He appears well-developed and well-nourished.  HENT:  Head: Normocephalic and atraumatic.  Right  Ear: External ear normal.  Left Ear: External ear normal.  Eyes: Conjunctivae and EOM are normal. Pupils are equal, round, and reactive to light.  Neck: Normal range of motion. Neck supple.  Cardiovascular: Normal rate, regular rhythm, normal heart sounds and intact distal pulses.   Pulmonary/Chest: Effort normal and breath sounds normal.  Abdominal: Soft. Bowel sounds are normal. There is no splenomegaly or hepatomegaly.  Genitourinary:  Genitourinary Comments: Be done next week at oncology  Musculoskeletal: Normal range of motion.  Neurological: He is alert and oriented to person, place, and time. He has normal reflexes.  Skin: No rash noted. No erythema.  Psychiatric: He has a normal mood and affect. His behavior is normal. Judgment and thought content normal.    Results for orders placed or performed in visit on 06/24/15  Comprehensive metabolic panel  Result Value Ref Range   Glucose 99 65 - 99 mg/dL   BUN 19 6 - 24 mg/dL   Creatinine, Ser 0.87 0.76 - 1.27 mg/dL   GFR calc non Af Amer 96 >59 mL/min/1.73   GFR calc Af Amer 111 >59 mL/min/1.73   BUN/Creatinine Ratio 22 (H) 9 - 20   Sodium 139 134 - 144 mmol/L   Potassium 4.3 3.5 - 5.2 mmol/L   Chloride 101 97 - 108 mmol/L   CO2 24 18 - 29 mmol/L   Calcium 9.7 8.7 - 10.2  mg/dL   Total Protein 7.0 6.0 - 8.5 g/dL   Albumin 4.7 3.5 - 5.5 g/dL   Globulin, Total 2.3 1.5 - 4.5 g/dL   Albumin/Globulin Ratio 2.0 1.1 - 2.5   Bilirubin Total 0.4 0.0 - 1.2 mg/dL   Alkaline Phosphatase 63 39 - 117 IU/L   AST 44 (H) 0 - 40 IU/L   ALT 64 (H) 0 - 44 IU/L  Lipid panel  Result Value Ref Range   Cholesterol, Total 212 (H) 100 - 199 mg/dL   Triglycerides 179 (H) 0 - 149 mg/dL   HDL 43 >39 mg/dL   VLDL Cholesterol Cal 36 5 - 40 mg/dL   LDL Calculated 133 (H) 0 - 99 mg/dL   Chol/HDL Ratio 4.9 0.0 - 5.0 ratio units  CBC with Differential/Platelet  Result Value Ref Range   WBC 7.3 3.4 - 10.8 x10E3/uL   RBC 4.78 4.14 - 5.80 x10E6/uL    Hemoglobin 14.0 12.6 - 17.7 g/dL   Hematocrit 42.6 37.5 - 51.0 %   MCV 89 79 - 97 fL   MCH 29.3 26.6 - 33.0 pg   MCHC 32.9 31.5 - 35.7 g/dL   RDW 12.5 12.3 - 15.4 %   Platelets 196 150 - 379 x10E3/uL   Neutrophils 66 %   Lymphs 24 %   Monocytes 8 %   Eos 2 %   Basos 0 %   Neutrophils Absolute 4.8 1.4 - 7.0 x10E3/uL   Lymphocytes Absolute 1.8 0.7 - 3.1 x10E3/uL   Monocytes Absolute 0.6 0.1 - 0.9 x10E3/uL   EOS (ABSOLUTE) 0.1 0.0 - 0.4 x10E3/uL   Basophils Absolute 0.0 0.0 - 0.2 x10E3/uL   Immature Granulocytes 0 %   Immature Grans (Abs) 0.0 0.0 - 0.1 x10E3/uL  TSH  Result Value Ref Range   TSH 1.620 0.450 - 4.500 uIU/mL  Urinalysis, Routine w reflex microscopic (not at [facility])  Result Value Ref Range   Specific Gravity, UA 1.030 1.005 - 1.030   pH, UA 5.0 5.0 - 7.5   Color, UA Yellow Yellow   Appearance Ur Clear Clear   Leukocytes, UA Negative Negative   Protein, UA Negative Negative/Trace   Glucose, UA Negative Negative   Ketones, UA Negative Negative   RBC, UA Negative Negative   Bilirubin, UA Negative Negative   Urobilinogen, Ur 0.2 0.2 - 1.0 mg/dL   Nitrite, UA Negative Negative      Assessment & Plan:   Problem List Items Addressed This Visit      Cardiovascular and Mediastinum   Hypertension    The current medical regimen is effective;  continue present plan and medications.       Relevant Medications   benazepril (LOTENSIN) 40 MG tablet   amLODipine (NORVASC) 5 MG tablet     Other   Insomnia    The current medical regimen is effective;  continue present plan and medications.       Relevant Medications   zaleplon (SONATA) 10 MG capsule    Other Visit Diagnoses    Annual physical exam    -  Primary   Relevant Orders   Hepatitis C Antibody   CBC with Differential/Platelet   Comprehensive metabolic panel   Lipid Panel w/o Chol/HDL Ratio   TSH   Urinalysis, Routine w reflex microscopic (not at [facility])   HIV antibody       Follow up plan: Return  in about 6 months (around 12/28/2016), or if symptoms worsen or fail to improve, for  BMP.

## 2016-06-28 LAB — CBC WITH DIFFERENTIAL/PLATELET
BASOS: 1 %
Basophils Absolute: 0 10*3/uL (ref 0.0–0.2)
EOS (ABSOLUTE): 0.1 10*3/uL (ref 0.0–0.4)
EOS: 1 %
HEMATOCRIT: 42 % (ref 37.5–51.0)
HEMOGLOBIN: 13.9 g/dL (ref 12.6–17.7)
IMMATURE GRANS (ABS): 0 10*3/uL (ref 0.0–0.1)
IMMATURE GRANULOCYTES: 0 %
Lymphocytes Absolute: 1.8 10*3/uL (ref 0.7–3.1)
Lymphs: 25 %
MCH: 29.3 pg (ref 26.6–33.0)
MCHC: 33.1 g/dL (ref 31.5–35.7)
MCV: 88 fL (ref 79–97)
MONOCYTES: 10 %
Monocytes Absolute: 0.7 10*3/uL (ref 0.1–0.9)
NEUTROS PCT: 63 %
Neutrophils Absolute: 4.8 10*3/uL (ref 1.4–7.0)
Platelets: 175 10*3/uL (ref 150–379)
RBC: 4.75 x10E6/uL (ref 4.14–5.80)
RDW: 12.7 % (ref 12.3–15.4)
WBC: 7.4 10*3/uL (ref 3.4–10.8)

## 2016-06-28 LAB — COMPREHENSIVE METABOLIC PANEL
ALBUMIN: 4.5 g/dL (ref 3.5–5.5)
ALT: 46 IU/L — ABNORMAL HIGH (ref 0–44)
AST: 32 IU/L (ref 0–40)
Albumin/Globulin Ratio: 2 (ref 1.2–2.2)
Alkaline Phosphatase: 56 IU/L (ref 39–117)
BUN / CREAT RATIO: 20 (ref 9–20)
BUN: 18 mg/dL (ref 6–24)
Bilirubin Total: 0.3 mg/dL (ref 0.0–1.2)
CO2: 25 mmol/L (ref 18–29)
CREATININE: 0.89 mg/dL (ref 0.76–1.27)
Calcium: 9.5 mg/dL (ref 8.7–10.2)
Chloride: 100 mmol/L (ref 96–106)
GFR calc Af Amer: 109 mL/min/{1.73_m2} (ref >59)
GFR calc non Af Amer: 94 mL/min/{1.73_m2} (ref >59)
GLOBULIN, TOTAL: 2.3 g/dL (ref 1.5–4.5)
Glucose: 104 mg/dL — ABNORMAL HIGH (ref 65–99)
Potassium: 4 mmol/L (ref 3.5–5.2)
Sodium: 140 mmol/L (ref 134–144)
TOTAL PROTEIN: 6.8 g/dL (ref 6.0–8.5)

## 2016-06-28 LAB — TSH: TSH: 1.87 u[IU]/mL (ref 0.450–4.500)

## 2016-06-28 LAB — LIPID PANEL W/O CHOL/HDL RATIO
Cholesterol, Total: 199 mg/dL (ref 100–199)
HDL: 47 mg/dL (ref >39)
LDL CALC: 113 mg/dL — AB (ref 0–99)
TRIGLYCERIDES: 195 mg/dL — AB (ref 0–149)
VLDL Cholesterol Cal: 39 mg/dL (ref 5–40)

## 2016-06-28 LAB — HEPATITIS C ANTIBODY: Hep C Virus Ab: 2.3 s/co ratio — ABNORMAL HIGH (ref 0.0–0.9)

## 2016-06-28 LAB — HIV ANTIBODY (ROUTINE TESTING W REFLEX): HIV Screen 4th Generation wRfx: NONREACTIVE

## 2016-06-29 ENCOUNTER — Telehealth: Payer: Self-pay | Admitting: Family Medicine

## 2016-06-29 DIAGNOSIS — R768 Other specified abnormal immunological findings in serum: Secondary | ICD-10-CM

## 2016-06-29 NOTE — Telephone Encounter (Signed)
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

## 2016-08-14 ENCOUNTER — Encounter: Payer: Self-pay | Admitting: Family Medicine

## 2016-08-14 ENCOUNTER — Ambulatory Visit (INDEPENDENT_AMBULATORY_CARE_PROVIDER_SITE_OTHER): Payer: BLUE CROSS/BLUE SHIELD | Admitting: Family Medicine

## 2016-08-14 VITALS — BP 128/78 | HR 71 | Temp 98.2°F | Ht 69.0 in | Wt 200.2 lb

## 2016-08-14 DIAGNOSIS — M1990 Unspecified osteoarthritis, unspecified site: Secondary | ICD-10-CM | POA: Insufficient documentation

## 2016-08-14 DIAGNOSIS — I10 Essential (primary) hypertension: Secondary | ICD-10-CM | POA: Diagnosis not present

## 2016-08-14 DIAGNOSIS — Z23 Encounter for immunization: Secondary | ICD-10-CM | POA: Diagnosis not present

## 2016-08-14 MED ORDER — MELOXICAM 15 MG PO TABS
15.0000 mg | ORAL_TABLET | Freq: Every day | ORAL | 3 refills | Status: DC
Start: 2016-08-14 — End: 2016-12-28

## 2016-08-14 NOTE — Progress Notes (Signed)
BP 128/78 (BP Location: Left Arm, Patient Position: Sitting, Cuff Size: Normal)   Pulse 71   Temp 98.2 F (36.8 C)   Ht 5\' 9"  (1.753 m)   Wt 200 lb 3.2 oz (90.8 kg)   SpO2 95%   BMI 29.56 kg/m    Subjective:    Patient ID: Austin Wall, male    DOB: 12/10/1955, 60 y.o.   MRN: [id number]  HPI: Austin Wall is a 60 y.o. male  Chief Complaint  Patient presents with  . Hand Pain    Right hand  . Arthritis  Patient with right hand arthritis changes of the middle joint of his third fourth and fifth fingers no known real repetitive trauma but does do a lot of hand work no numbness or tingling just marked stiffness and weakness of grip some slight swelling. No redness no other known trauma Blood pressures doing well no complaints from medications and reviewed medications with no other issues.   Relevant past medical, surgical, family and social history reviewed and updated as indicated. Interim medical history since our last visit reviewed. Allergies and medications reviewed and updated.  Review of Systems  Constitutional: Negative.   Respiratory: Negative.   Cardiovascular: Negative.     Per HPI unless specifically indicated above     Objective:    BP 128/78 (BP Location: Left Arm, Patient Position: Sitting, Cuff Size: Normal)   Pulse 71   Temp 98.2 F (36.8 C)   Ht 5\' 9"  (1.753 m)   Wt 200 lb 3.2 oz (90.8 kg)   SpO2 95%   BMI 29.56 kg/m   Wt Readings from Last 3 Encounters:  08/14/16 200 lb 3.2 oz (90.8 kg)  06/27/16 201 lb (91.2 kg)  12/28/15 199 lb (90.3 kg)    Physical Exam  Constitutional: He is oriented to person, place, and time. He appears well-developed and well-nourished. No distress.  HENT:  Head: Normocephalic and atraumatic.  Right Ear: Hearing normal.  Left Ear: Hearing normal.  Nose: Nose normal.  Eyes: Conjunctivae and lids are normal. Right eye exhibits no discharge. Left eye exhibits no discharge. No scleral icterus.  Pulmonary/Chest:  Effort normal. No respiratory distress.  Musculoskeletal: Normal range of motion.  Fingers with some slight swelling as mentioned above otherwise full range of motion no joint laxity some tenderness on AP pressure.  Neurological: He is alert and oriented to person, place, and time.  Skin: Skin is intact. No rash noted.  Psychiatric: He has a normal mood and affect. His speech is normal and behavior is normal. Judgment and thought content normal. Cognition and memory are normal.    Results for orders placed or performed in visit on 06/27/16  Microscopic Examination  Result Value Ref Range   WBC, UA None seen 0 - 5 /hpf   RBC, UA 0-2 0 - 2 /hpf   Epithelial Cells (non renal) None seen 0 - 10 /hpf  Hepatitis C Antibody  Result Value Ref Range   Hep C Virus Ab 2.3 (H) 0.0 - 0.9 s/co ratio  CBC with Differential/Platelet  Result Value Ref Range   WBC 7.4 3.4 - 10.8 x10E3/uL   RBC 4.75 4.14 - 5.80 x10E6/uL   Hemoglobin 13.9 12.6 - 17.7 g/dL   Hematocrit 42.0 37.5 - 51.0 %   MCV 88 79 - 97 fL   MCH 29.3 26.6 - 33.0 pg   MCHC 33.1 31.5 - 35.7 g/dL   RDW 12.7 12.3 - 15.4 %  Platelets 175 150 - 379 x10E3/uL   Neutrophils 63 %   Lymphs 25 %   Monocytes 10 %   Eos 1 %   Basos 1 %   Neutrophils Absolute 4.8 1.4 - 7.0 x10E3/uL   Lymphocytes Absolute 1.8 0.7 - 3.1 x10E3/uL   Monocytes Absolute 0.7 0.1 - 0.9 x10E3/uL   EOS (ABSOLUTE) 0.1 0.0 - 0.4 x10E3/uL   Basophils Absolute 0.0 0.0 - 0.2 x10E3/uL   Immature Granulocytes 0 %   Immature Grans (Abs) 0.0 0.0 - 0.1 x10E3/uL  Comprehensive metabolic panel  Result Value Ref Range   Glucose 104 (H) 65 - 99 mg/dL   BUN 18 6 - 24 mg/dL   Creatinine, Ser 0.89 0.76 - 1.27 mg/dL   GFR calc non Af Amer 94 >59 mL/min/1.73   GFR calc Af Amer 109 >59 mL/min/1.73   BUN/Creatinine Ratio 20 9 - 20   Sodium 140 134 - 144 mmol/L   Potassium 4.0 3.5 - 5.2 mmol/L   Chloride 100 96 - 106 mmol/L   CO2 25 18 - 29 mmol/L   Calcium 9.5 8.7 - 10.2 mg/dL    Total Protein 6.8 6.0 - 8.5 g/dL   Albumin 4.5 3.5 - 5.5 g/dL   Globulin, Total 2.3 1.5 - 4.5 g/dL   Albumin/Globulin Ratio 2.0 1.2 - 2.2   Bilirubin Total 0.3 0.0 - 1.2 mg/dL   Alkaline Phosphatase 56 39 - 117 IU/L   AST 32 0 - 40 IU/L   ALT 46 (H) 0 - 44 IU/L  Lipid Panel w/o Chol/HDL Ratio  Result Value Ref Range   Cholesterol, Total 199 100 - 199 mg/dL   Triglycerides 195 (H) 0 - 149 mg/dL   HDL 47 >39 mg/dL   VLDL Cholesterol Cal 39 5 - 40 mg/dL   LDL Calculated 113 (H) 0 - 99 mg/dL  TSH  Result Value Ref Range   TSH 1.870 0.450 - 4.500 uIU/mL  Urinalysis, Routine w reflex microscopic (not at [facility])  Result Value Ref Range   Specific Gravity, UA 1.020 1.005 - 1.030   pH, UA 6.0 5.0 - 7.5   Color, UA Yellow Yellow   Appearance Ur Clear Clear   Leukocytes, UA Negative Negative   Protein, UA Negative Negative/Trace   Glucose, UA Negative Negative   Ketones, UA Negative Negative   RBC, UA Trace (A) Negative   Bilirubin, UA Negative Negative   Urobilinogen, Ur 1.0 0.2 - 1.0 mg/dL   Nitrite, UA Negative Negative   Microscopic Examination See below:   HIV antibody  Result Value Ref Range   HIV Screen 4th Generation wRfx Non Reactive Non Reactive      Assessment & Plan:   Problem List Items Addressed This Visit      Cardiovascular and Mediastinum   Hypertension    The current medical regimen is effective;  continue present plan and medications.         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 Will try meloxicam if continued to be progressive will consider referral to rheumatology Discussed lifestyle and work issues      Relevant Medications   meloxicam (MOBIC) 15 MG tablet    Other Visit Diagnoses    Need for influenza vaccination    -  Primary   Relevant Orders   Flu Vaccine QUAD 36+ mos PF IM (Fluarix & Fluzone Quad PF)       Follow  up plan: Return for As scheduled.

## 2016-08-14 NOTE — Assessment & Plan Note (Signed)
The current medical regimen is effective;  continue present plan and medications.  

## 2016-08-14 NOTE — Assessment & Plan Note (Signed)
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 Will try meloxicam if continued to be progressive will consider referral to rheumatology Discussed lifestyle and work issues

## 2016-09-04 ENCOUNTER — Telehealth: Payer: Self-pay | Admitting: Family Medicine

## 2016-09-04 DIAGNOSIS — M1990 Unspecified osteoarthritis, unspecified site: Secondary | ICD-10-CM

## 2016-09-04 NOTE — Telephone Encounter (Signed)
Routing to provider  

## 2016-09-04 NOTE — Telephone Encounter (Signed)
Pt called stated the medication was not helping with his arthritis. Pt stated Dr. Jeananne Rama stated he would refer him to a specialist. Pt is calling for that referral. Thanks.

## 2016-09-04 NOTE — Telephone Encounter (Signed)
Referral generated

## 2016-09-13 ENCOUNTER — Inpatient Hospital Stay: Admission: RE | Admit: 2016-09-13 | Payer: BLUE CROSS/BLUE SHIELD | Source: Ambulatory Visit

## 2016-09-13 ENCOUNTER — Ambulatory Visit
Admission: RE | Admit: 2016-09-13 | Discharge: 2016-09-13 | Disposition: A | Discharge status: Home / Self Care | Payer: BLUE CROSS/BLUE SHIELD | Source: Ambulatory Visit | Attending: Internal Medicine | Admitting: Internal Medicine

## 2016-09-13 ENCOUNTER — Other Ambulatory Visit: Payer: Self-pay | Admitting: Internal Medicine

## 2016-09-13 DIAGNOSIS — M79641 Pain in right hand: Secondary | ICD-10-CM

## 2016-09-13 DIAGNOSIS — M7989 Other specified soft tissue disorders: Secondary | ICD-10-CM | POA: Diagnosis not present

## 2016-09-13 DIAGNOSIS — G8929 Other chronic pain: Secondary | ICD-10-CM | POA: Insufficient documentation

## 2016-09-15 DIAGNOSIS — S86819A Strain of other muscle(s) and tendon(s) at lower leg level, unspecified leg, initial encounter: Secondary | ICD-10-CM | POA: Insufficient documentation

## 2016-12-11 ENCOUNTER — Other Ambulatory Visit: Payer: Self-pay | Admitting: Family Medicine

## 2016-12-28 ENCOUNTER — Encounter: Payer: Self-pay | Admitting: Family Medicine

## 2016-12-28 ENCOUNTER — Ambulatory Visit (INDEPENDENT_AMBULATORY_CARE_PROVIDER_SITE_OTHER): Payer: BLUE CROSS/BLUE SHIELD | Admitting: Family Medicine

## 2016-12-28 VITALS — BP 124/82 | HR 71 | Ht 69.0 in | Wt 203.0 lb

## 2016-12-28 DIAGNOSIS — G4700 Insomnia, unspecified: Secondary | ICD-10-CM | POA: Diagnosis not present

## 2016-12-28 DIAGNOSIS — I10 Essential (primary) hypertension: Secondary | ICD-10-CM | POA: Diagnosis not present

## 2016-12-28 DIAGNOSIS — E785 Hyperlipidemia, unspecified: Secondary | ICD-10-CM | POA: Diagnosis not present

## 2016-12-28 MED ORDER — MELOXICAM 15 MG PO TABS: 15.0000 mg | ORAL_TABLET | Freq: Every day | ORAL | 3 refills | Status: DC

## 2016-12-28 MED ORDER — ZALEPLON 10 MG PO CAPS: 10.0000 mg | ORAL_CAPSULE | Freq: Every evening | ORAL | 5 refills | Status: DC | PRN

## 2016-12-28 NOTE — Progress Notes (Signed)
BP 124/82   Pulse 71   Ht 5\' 9"  (1.753 m)   Wt 203 lb (92.1 kg)   SpO2 99%   BMI 29.98 kg/m    Subjective:    Patient ID: Austin Wall, male    DOB: Aug 02, 1956, 61 y.o.   MRN: [id number]  HPI: Austin Wall is a 61 y.o. male  Chief Complaint  Patient presents with  . Follow-up  Patient follow-up doing well blood pressure good control no complaints from medications. Lorazepam has rare use. Keeps at work. Takes Sonata every night would not sleep without it. Has used the same dose long-term.  Relevant past medical, surgical, family and social history reviewed and updated as indicated. Interim medical history since our last visit reviewed. Allergies and medications reviewed and updated.  Review of Systems  Constitutional: Negative.   Respiratory: Negative.   Cardiovascular: Negative.     Per HPI unless specifically indicated above     Objective:    BP 124/82   Pulse 71   Ht 5\' 9"  (1.753 m)   Wt 203 lb (92.1 kg)   SpO2 99%   BMI 29.98 kg/m   Wt Readings from Last 3 Encounters:  12/28/16 203 lb (92.1 kg)  08/14/16 200 lb 3.2 oz (90.8 kg)  06/27/16 201 lb (91.2 kg)    Physical Exam  Constitutional: He is oriented to person, place, and time. He appears well-developed and well-nourished.  HENT:  Head: Normocephalic and atraumatic.  Eyes: Conjunctivae and EOM are normal.  Neck: Normal range of motion.  Cardiovascular: Normal rate, regular rhythm and normal heart sounds.   Pulmonary/Chest: Effort normal and breath sounds normal.  Musculoskeletal: Normal range of motion.  Neurological: He is alert and oriented to person, place, and time.  Skin: No erythema.  Psychiatric: He has a normal mood and affect. His behavior is normal. Judgment and thought content normal.    Results for orders placed or performed in visit on 06/27/16  Microscopic Examination  Result Value Ref Range   WBC, UA None seen 0 - 5 /hpf   RBC, UA 0-2 0 - 2 /hpf   Epithelial Cells (non  renal) None seen 0 - 10 /hpf  Hepatitis C Antibody  Result Value Ref Range   Hep C Virus Ab 2.3 (H) 0.0 - 0.9 s/co ratio  CBC with Differential/Platelet  Result Value Ref Range   WBC 7.4 3.4 - 10.8 x10E3/uL   RBC 4.75 4.14 - 5.80 x10E6/uL   Hemoglobin 13.9 12.6 - 17.7 g/dL   Hematocrit 42.0 37.5 - 51.0 %   MCV 88 79 - 97 fL   MCH 29.3 26.6 - 33.0 pg   MCHC 33.1 31.5 - 35.7 g/dL   RDW 12.7 12.3 - 15.4 %   Platelets 175 150 - 379 x10E3/uL   Neutrophils 63 %   Lymphs 25 %   Monocytes 10 %   Eos 1 %   Basos 1 %   Neutrophils Absolute 4.8 1.4 - 7.0 x10E3/uL   Lymphocytes Absolute 1.8 0.7 - 3.1 x10E3/uL   Monocytes Absolute 0.7 0.1 - 0.9 x10E3/uL   EOS (ABSOLUTE) 0.1 0.0 - 0.4 x10E3/uL   Basophils Absolute 0.0 0.0 - 0.2 x10E3/uL   Immature Granulocytes 0 %   Immature Grans (Abs) 0.0 0.0 - 0.1 x10E3/uL  Comprehensive metabolic panel  Result Value Ref Range   Glucose 104 (H) 65 - 99 mg/dL   BUN 18 6 - 24 mg/dL   Creatinine, Ser 0.89 0.76 -  1.27 mg/dL   GFR calc non Af Amer 94 >59 mL/min/1.73   GFR calc Af Amer 109 >59 mL/min/1.73   BUN/Creatinine Ratio 20 9 - 20   Sodium 140 134 - 144 mmol/L   Potassium 4.0 3.5 - 5.2 mmol/L   Chloride 100 96 - 106 mmol/L   CO2 25 18 - 29 mmol/L   Calcium 9.5 8.7 - 10.2 mg/dL   Total Protein 6.8 6.0 - 8.5 g/dL   Albumin 4.5 3.5 - 5.5 g/dL   Globulin, Total 2.3 1.5 - 4.5 g/dL   Albumin/Globulin Ratio 2.0 1.2 - 2.2   Bilirubin Total 0.3 0.0 - 1.2 mg/dL   Alkaline Phosphatase 56 39 - 117 IU/L   AST 32 0 - 40 IU/L   ALT 46 (H) 0 - 44 IU/L  Lipid Panel w/o Chol/HDL Ratio  Result Value Ref Range   Cholesterol, Total 199 100 - 199 mg/dL   Triglycerides 195 (H) 0 - 149 mg/dL   HDL 47 >39 mg/dL   VLDL Cholesterol Cal 39 5 - 40 mg/dL   LDL Calculated 113 (H) 0 - 99 mg/dL  TSH  Result Value Ref Range   TSH 1.870 0.450 - 4.500 uIU/mL  Urinalysis, Routine w reflex microscopic (not at [facility])  Result Value Ref Range   Specific Gravity, UA 1.020  1.005 - 1.030   pH, UA 6.0 5.0 - 7.5   Color, UA Yellow Yellow   Appearance Ur Clear Clear   Leukocytes, UA Negative Negative   Protein, UA Negative Negative/Trace   Glucose, UA Negative Negative   Ketones, UA Negative Negative   RBC, UA Trace (A) Negative   Bilirubin, UA Negative Negative   Urobilinogen, Ur 1.0 0.2 - 1.0 mg/dL   Nitrite, UA Negative Negative   Microscopic Examination See below:   HIV antibody  Result Value Ref Range   HIV Screen 4th Generation wRfx Non Reactive Non Reactive      Assessment & Plan:   Problem List Items Addressed This Visit      Cardiovascular and Mediastinum   Hypertension - Primary    The current medical regimen is effective;  continue present plan and medications.       Relevant Orders   Basic metabolic panel     Other   Hyperlipidemia   Relevant Orders   Basic metabolic panel   Insomnia    The current medical regimen is effective;  continue present plan and medications.       Relevant Medications   zaleplon (SONATA) 10 MG capsule       Follow up plan: Return in about 6 months (around 06/27/2017) for Physical Exam.

## 2016-12-28 NOTE — Assessment & Plan Note (Signed)
The current medical regimen is effective;  continue present plan and medications.  

## 2016-12-29 LAB — BASIC METABOLIC PANEL
BUN/Creatinine Ratio: 19 (ref 9–20)
BUN: 23 mg/dL (ref 6–24)
CALCIUM: 9.6 mg/dL (ref 8.7–10.2)
CO2: 24 mmol/L (ref 18–29)
CREATININE: 1.22 mg/dL (ref 0.76–1.27)
Chloride: 102 mmol/L (ref 96–106)
GFR calc Af Amer: 75 (ref >59)
GFR, EST NON AFRICAN AMERICAN: 65 (ref >59)
Glucose: 109 mg/dL — ABNORMAL HIGH (ref 65–99)
Potassium: 3.9 mmol/L (ref 3.5–5.2)
Sodium: 141 mmol/L (ref 134–144)

## 2017-01-01 ENCOUNTER — Encounter: Payer: Self-pay | Admitting: Family Medicine

## 2017-02-08 ENCOUNTER — Other Ambulatory Visit: Payer: Self-pay | Admitting: Family Medicine

## 2017-02-08 NOTE — Telephone Encounter (Signed)
L Last routine OV: 12/28/16 Next OV: 06/25/17

## 2017-02-09 ENCOUNTER — Other Ambulatory Visit: Payer: Self-pay | Admitting: Family Medicine

## 2017-05-04 ENCOUNTER — Other Ambulatory Visit: Payer: Self-pay | Admitting: Family Medicine

## 2017-06-25 ENCOUNTER — Encounter: Payer: Self-pay | Admitting: Family Medicine

## 2017-06-25 ENCOUNTER — Ambulatory Visit (INDEPENDENT_AMBULATORY_CARE_PROVIDER_SITE_OTHER): Payer: BLUE CROSS/BLUE SHIELD | Admitting: Family Medicine

## 2017-06-25 VITALS — BP 120/79 | HR 92 | Ht 70.25 in | Wt 201.0 lb

## 2017-06-25 DIAGNOSIS — I10 Essential (primary) hypertension: Secondary | ICD-10-CM

## 2017-06-25 DIAGNOSIS — F419 Anxiety disorder, unspecified: Secondary | ICD-10-CM | POA: Insufficient documentation

## 2017-06-25 DIAGNOSIS — Z1329 Encounter for screening for other suspected endocrine disorder: Secondary | ICD-10-CM

## 2017-06-25 DIAGNOSIS — E785 Hyperlipidemia, unspecified: Secondary | ICD-10-CM

## 2017-06-25 DIAGNOSIS — Z131 Encounter for screening for diabetes mellitus: Secondary | ICD-10-CM

## 2017-06-25 DIAGNOSIS — C61 Malignant neoplasm of prostate: Secondary | ICD-10-CM

## 2017-06-25 DIAGNOSIS — G4700 Insomnia, unspecified: Secondary | ICD-10-CM

## 2017-06-25 DIAGNOSIS — Z0000 Encounter for general adult medical examination without abnormal findings: Secondary | ICD-10-CM

## 2017-06-25 LAB — MICROSCOPIC EXAMINATION: BACTERIA UA: NONE SEEN

## 2017-06-25 LAB — URINALYSIS, ROUTINE W REFLEX MICROSCOPIC
BILIRUBIN UA: NEGATIVE
Glucose, UA: NEGATIVE
Leukocytes, UA: NEGATIVE
NITRITE UA: NEGATIVE
PH UA: 5.5 (ref 5.0–7.5)
Protein, UA: NEGATIVE
RBC UA: NEGATIVE
Specific Gravity, UA: 1.02 (ref 1.005–1.030)
UUROB: 1 mg/dL (ref 0.2–1.0)

## 2017-06-25 MED ORDER — MELOXICAM 15 MG PO TABS: 15.0000 mg | ORAL_TABLET | Freq: Every day | ORAL | 2 refills | Status: DC

## 2017-06-25 MED ORDER — AMLODIPINE BESYLATE 5 MG PO TABS: 5.0000 mg | ORAL_TABLET | Freq: Every day | ORAL | 4 refills | Status: DC

## 2017-06-25 MED ORDER — ZALEPLON 10 MG PO CAPS: 10.0000 mg | ORAL_CAPSULE | Freq: Every evening | ORAL | 5 refills | Status: DC | PRN

## 2017-06-25 MED ORDER — LORAZEPAM 1 MG PO TABS
1.0000 mg | ORAL_TABLET | Freq: Every day | ORAL | 1 refills | Status: DC | PRN
Start: 2017-06-25 — End: 2017-10-24

## 2017-06-25 MED ORDER — BENAZEPRIL HCL 40 MG PO TABS: 40.0000 mg | ORAL_TABLET | Freq: Every day | ORAL | 4 refills | Status: DC

## 2017-06-25 NOTE — Assessment & Plan Note (Signed)
The current medical regimen is effective;  continue present plan and medications.  

## 2017-06-25 NOTE — Assessment & Plan Note (Signed)
Use of PRN meds

## 2017-06-25 NOTE — Progress Notes (Signed)
BP 120/79   Pulse 92   Ht 5' 10.25" (1.784 m)   Wt 201 lb (91.2 kg)   SpO2 99%   BMI 28.64 kg/m    Subjective:    Patient ID: Austin Wall, male    DOB: September 03, 1956, 61 y.o.   MRN: [id number]  HPI: Austin Wall is a 61 y.o. male  Chief Complaint  Patient presents with  . Annual Exam   Patient all in all doing well taking blood pressure medicines without problems in spite of great deal of stress going on today blood pressures been doing good Takes lorazepam on a when necessary basis 30 tablets has lasted with a refill 6+ months. Takes Sonata every night without problems for insomnia.  Relevant past medical, surgical, family and social history reviewed and updated as indicated. Interim medical history since our last visit reviewed. Allergies and medications reviewed and updated.  Review of Systems  Constitutional: Negative.   HENT: Negative.   Eyes: Negative.   Respiratory: Negative.   Cardiovascular: Negative.   Gastrointestinal: Negative.   Endocrine: Negative.   Genitourinary: Negative.   Musculoskeletal: Negative.   Skin: Negative.   Allergic/Immunologic: Negative.   Neurological: Negative.   Hematological: Negative.   Psychiatric/Behavioral: Negative.     Per HPI unless specifically indicated above     Objective:    BP 120/79   Pulse 92   Ht 5' 10.25" (1.784 m)   Wt 201 lb (91.2 kg)   SpO2 99%   BMI 28.64 kg/m   Wt Readings from Last 3 Encounters:  06/25/17 201 lb (91.2 kg)  12/28/16 203 lb (92.1 kg)  08/14/16 200 lb 3.2 oz (90.8 kg)    Physical Exam  Constitutional: He is oriented to person, place, and time. He appears well-developed and well-nourished.  HENT:  Head: Normocephalic and atraumatic.  Right Ear: External ear normal.  Left Ear: External ear normal.  Eyes: Pupils are equal, round, and reactive to light. Conjunctivae and EOM are normal.  Neck: Normal range of motion. Neck supple.  Cardiovascular: Normal rate, regular rhythm,  normal heart sounds and intact distal pulses.   Pulmonary/Chest: Effort normal and breath sounds normal.  Abdominal: Soft. Bowel sounds are normal. There is no splenomegaly or hepatomegaly.  Genitourinary: Rectum normal, prostate normal and penis normal.  Musculoskeletal: Normal range of motion.  Neurological: He is alert and oriented to person, place, and time. He has normal reflexes.  Skin: No rash noted. No erythema.  Psychiatric: He has a normal mood and affect. His behavior is normal. Judgment and thought content normal.    Results for orders placed or performed in visit on 53/97/67  Basic metabolic panel  Result Value Ref Range   Glucose 109 (H) 65 - 99 mg/dL   BUN 23 6 - 24 mg/dL   Creatinine, Ser 1.22 0.76 - 1.27 mg/dL   GFR calc non Af Amer 65 >59   GFR calc Af Amer 75 >59   BUN/Creatinine Ratio 19 9 - 20   Sodium 141 134 - 144 mmol/L   Potassium 3.9 3.5 - 5.2 mmol/L   Chloride 102 96 - 106 mmol/L   CO2 24 18 - 29 mmol/L   Calcium 9.6 8.7 - 10.2 mg/dL      Assessment & Plan:   Problem List Items Addressed This Visit      Cardiovascular and Mediastinum   Hypertension - Primary   Relevant Medications   benazepril (LOTENSIN) 40 MG tablet   amLODipine (  NORVASC) 5 MG tablet   Other Relevant Orders   CBC with Differential/Platelet     Genitourinary   Prostate CA (HCC)   Relevant Medications   LORazepam (ATIVAN) 1 MG tablet   Other Relevant Orders   PSA     Other   Hyperlipidemia   Relevant Medications   benazepril (LOTENSIN) 40 MG tablet   amLODipine (NORVASC) 5 MG tablet   Other Relevant Orders   Lipid panel   Insomnia    The current medical regimen is effective;  continue present plan and medications.       Relevant Medications   zaleplon (SONATA) 10 MG capsule   Anxiety    Use of PRN meds      Relevant Medications   LORazepam (ATIVAN) 1 MG tablet    Other Visit Diagnoses    Annual physical exam       Screening for diabetes mellitus (DM)        Relevant Orders   Comprehensive metabolic panel   Urinalysis, Routine w reflex microscopic   Thyroid disorder screen       Relevant Orders   TSH       Follow up plan: Return in about 6 months (around 12/26/2017) for BMP.

## 2017-06-26 ENCOUNTER — Telehealth: Payer: Self-pay | Admitting: Family Medicine

## 2017-06-26 ENCOUNTER — Encounter: Payer: Self-pay | Admitting: Family Medicine

## 2017-06-26 LAB — CBC WITH DIFFERENTIAL/PLATELET
Basophils Absolute: 0 10*3/uL (ref 0.0–0.2)
Basos: 1 %
EOS (ABSOLUTE): 0.1 10*3/uL (ref 0.0–0.4)
EOS: 1 %
HEMATOCRIT: 47.3 % (ref 37.5–51.0)
HEMOGLOBIN: 16.1 g/dL (ref 13.0–17.7)
IMMATURE GRANS (ABS): 0 10*3/uL (ref 0.0–0.1)
Immature Granulocytes: 0 %
LYMPHS ABS: 1.2 10*3/uL (ref 0.7–3.1)
Lymphs: 19 %
MCH: 30.2 pg (ref 26.6–33.0)
MCHC: 34 g/dL (ref 31.5–35.7)
MCV: 89 fL (ref 79–97)
Monocytes Absolute: 0.7 10*3/uL (ref 0.1–0.9)
Monocytes: 10 %
NEUTROS ABS: 4.5 10*3/uL (ref 1.4–7.0)
Neutrophils: 69 %
Platelets: 193 10*3/uL (ref 150–379)
RBC: 5.33 x10E6/uL (ref 4.14–5.80)
RDW: 12.8 % (ref 12.3–15.4)
WBC: 6.5 10*3/uL (ref 3.4–10.8)

## 2017-06-26 LAB — COMPREHENSIVE METABOLIC PANEL
ALBUMIN: 4.6 g/dL (ref 3.5–5.5)
ALK PHOS: 57 IU/L (ref 39–117)
ALT: 71 IU/L — ABNORMAL HIGH (ref 0–44)
AST: 55 IU/L — ABNORMAL HIGH (ref 0–40)
Albumin/Globulin Ratio: 1.8 (ref 1.2–2.2)
BUN / CREAT RATIO: 22 — AB (ref 9–20)
BUN: 22 mg/dL (ref 6–24)
Bilirubin Total: 0.5 mg/dL (ref 0.0–1.2)
CO2: 21 mmol/L (ref 20–29)
CREATININE: 1.01 mg/dL (ref 0.76–1.27)
Calcium: 9.8 mg/dL (ref 8.7–10.2)
Chloride: 102 mmol/L (ref 96–106)
GFR calc non Af Amer: 81 mL/min/{1.73_m2} (ref >59)
GFR, EST AFRICAN AMERICAN: 94 mL/min/{1.73_m2} (ref >59)
GLOBULIN, TOTAL: 2.6 g/dL (ref 1.5–4.5)
Glucose: 94 mg/dL (ref 65–99)
Potassium: 4.1 mmol/L (ref 3.5–5.2)
SODIUM: 142 mmol/L (ref 134–144)
TOTAL PROTEIN: 7.2 g/dL (ref 6.0–8.5)

## 2017-06-26 LAB — LIPID PANEL
CHOL/HDL RATIO: 4.6 ratio (ref 0.0–5.0)
CHOLESTEROL TOTAL: 223 mg/dL — AB (ref 100–199)
HDL: 49 mg/dL (ref >39)
LDL CALC: 142 mg/dL — AB (ref 0–99)
Triglycerides: 162 mg/dL — ABNORMAL HIGH (ref 0–149)
VLDL CHOLESTEROL CAL: 32 mg/dL (ref 5–40)

## 2017-06-26 LAB — PSA: PROSTATE SPECIFIC AG, SERUM: 0.2 ng/mL (ref 0.0–4.0)

## 2017-06-26 LAB — TSH: TSH: 1.57 u[IU]/mL (ref 0.450–4.500)

## 2017-06-26 NOTE — Telephone Encounter (Signed)
Phone call Discussed with patient elevated cholesterol and liver function patient's been on vacation will do better with diet and exercise and will reassess at next office visit.

## 2017-10-24 ENCOUNTER — Other Ambulatory Visit: Payer: Self-pay | Admitting: Family Medicine

## 2017-10-24 DIAGNOSIS — F419 Anxiety disorder, unspecified: Secondary | ICD-10-CM

## 2017-12-14 ENCOUNTER — Other Ambulatory Visit: Payer: Self-pay | Admitting: Family Medicine

## 2017-12-14 MED ORDER — MELOXICAM 15 MG PO TABS: 15.0000 mg | ORAL_TABLET | Freq: Every day | ORAL | 0 refills | Status: DC

## 2017-12-26 ENCOUNTER — Ambulatory Visit: Payer: BLUE CROSS/BLUE SHIELD | Admitting: Family Medicine

## 2017-12-26 ENCOUNTER — Encounter: Payer: Self-pay | Admitting: Family Medicine

## 2017-12-26 ENCOUNTER — Other Ambulatory Visit: Payer: Self-pay | Admitting: Family Medicine

## 2017-12-26 VITALS — BP 155/86 | HR 88 | Ht 71.0 in | Wt 198.0 lb

## 2017-12-26 DIAGNOSIS — E785 Hyperlipidemia, unspecified: Secondary | ICD-10-CM

## 2017-12-26 DIAGNOSIS — G4700 Insomnia, unspecified: Secondary | ICD-10-CM

## 2017-12-26 DIAGNOSIS — I10 Essential (primary) hypertension: Secondary | ICD-10-CM

## 2017-12-26 MED ORDER — ZALEPLON 10 MG PO CAPS: 10.0000 mg | ORAL_CAPSULE | Freq: Every evening | ORAL | 5 refills | Status: DC | PRN

## 2017-12-26 NOTE — Assessment & Plan Note (Signed)
The current medical regimen is effective;  continue present plan and medications.  

## 2017-12-26 NOTE — Assessment & Plan Note (Addendum)
The current medical regimen is effective;  continue present plan and medications. Observe blood pressure and if not coming down will give [place] a call to increase amlodipine.

## 2017-12-26 NOTE — Progress Notes (Signed)
BP (!) 155/86   Pulse 88   Ht 5\' 11"  (1.803 m)   Wt 198 lb (89.8 kg)   SpO2 99%   BMI 27.62 kg/m    Subjective:    Patient ID: Austin Wall, male    DOB: 1955-11-17, 62 y.o.   MRN: [id number]  HPI: Austin Wall is a 62 y.o. male  Chief Complaint  Patient presents with  . Follow-up  . Hypertension   Patient follow-up hypertension doing well no complaints taking amlodipine benazepril faithfully without problems blood pressures been good. On chart review patient's always had consistently good blood pressure except for today. Takes lorazepam on rare days taking Sonata nightly without problems. Relevant past medical, surgical, family and social history reviewed and updated as indicated. Interim medical history since our last visit reviewed. Allergies and medications reviewed and updated.  Review of Systems  Constitutional: Negative.   Respiratory: Negative.   Cardiovascular: Negative.     Per HPI unless specifically indicated above     Objective:    BP (!) 155/86   Pulse 88   Ht 5\' 11"  (1.803 m)   Wt 198 lb (89.8 kg)   SpO2 99%   BMI 27.62 kg/m   Wt Readings from Last 3 Encounters:  12/26/17 198 lb (89.8 kg)  06/25/17 201 lb (91.2 kg)  12/28/16 203 lb (92.1 kg)    Physical Exam  Constitutional: He is oriented to person, place, and time. He appears well-developed and well-nourished.  HENT:  Head: Normocephalic and atraumatic.  Eyes: Conjunctivae and EOM are normal.  Neck: Normal range of motion.  Cardiovascular: Normal rate, regular rhythm and normal heart sounds.  Pulmonary/Chest: Effort normal and breath sounds normal.  Musculoskeletal: Normal range of motion.  Neurological: He is alert and oriented to person, place, and time.  Skin: No erythema.  Psychiatric: He has a normal mood and affect. His behavior is normal. Judgment and thought content normal.    Results for orders placed or performed in visit on 06/25/17  Microscopic Examination  Result  Value Ref Range   WBC, UA 0-5 0 - 5 /hpf   RBC, UA 0-2 0 - 2 /hpf   Epithelial Cells (non renal) CANCELED    Mucus, UA Present (A) Not Estab.   Bacteria, UA None seen None seen/Few  CBC with Differential/Platelet  Result Value Ref Range   WBC 6.5 3.4 - 10.8 x10E3/uL   RBC 5.33 4.14 - 5.80 x10E6/uL   Hemoglobin 16.1 13.0 - 17.7 g/dL   Hematocrit 47.3 37.5 - 51.0 %   MCV 89 79 - 97 fL   MCH 30.2 26.6 - 33.0 pg   MCHC 34.0 31.5 - 35.7 g/dL   RDW 12.8 12.3 - 15.4 %   Platelets 193 150 - 379 x10E3/uL   Neutrophils 69 Not Estab. %   Lymphs 19 Not Estab. %   Monocytes 10 Not Estab. %   Eos 1 Not Estab. %   Basos 1 Not Estab. %   Neutrophils Absolute 4.5 1.4 - 7.0 x10E3/uL   Lymphocytes Absolute 1.2 0.7 - 3.1 x10E3/uL   Monocytes Absolute 0.7 0.1 - 0.9 x10E3/uL   EOS (ABSOLUTE) 0.1 0.0 - 0.4 x10E3/uL   Basophils Absolute 0.0 0.0 - 0.2 x10E3/uL   Immature Granulocytes 0 Not Estab. %   Immature Grans (Abs) 0.0 0.0 - 0.1 x10E3/uL  Comprehensive metabolic panel  Result Value Ref Range   Glucose 94 65 - 99 mg/dL   BUN 22 6 -  24 mg/dL   Creatinine, Ser 1.01 0.76 - 1.27 mg/dL   GFR calc non Af Amer 81 >59 mL/min/1.73   GFR calc Af Amer 94 >59 mL/min/1.73   BUN/Creatinine Ratio 22 (H) 9 - 20   Sodium 142 134 - 144 mmol/L   Potassium 4.1 3.5 - 5.2 mmol/L   Chloride 102 96 - 106 mmol/L   CO2 21 20 - 29 mmol/L   Calcium 9.8 8.7 - 10.2 mg/dL   Total Protein 7.2 6.0 - 8.5 g/dL   Albumin 4.6 3.5 - 5.5 g/dL   Globulin, Total 2.6 1.5 - 4.5 g/dL   Albumin/Globulin Ratio 1.8 1.2 - 2.2   Bilirubin Total 0.5 0.0 - 1.2 mg/dL   Alkaline Phosphatase 57 39 - 117 IU/L   AST 55 (H) 0 - 40 IU/L   ALT 71 (H) 0 - 44 IU/L  Lipid panel  Result Value Ref Range   Cholesterol, Total 223 (H) 100 - 199 mg/dL   Triglycerides 162 (H) 0 - 149 mg/dL   HDL 49 >39 mg/dL   VLDL Cholesterol Cal 32 5 - 40 mg/dL   LDL Calculated 142 (H) 0 - 99 mg/dL   Chol/HDL Ratio 4.6 0.0 - 5.0 ratio  PSA  Result Value Ref  Range   Prostate Specific Ag, Serum 0.2 0.0 - 4.0 ng/mL  TSH  Result Value Ref Range   TSH 1.570 0.450 - 4.500 uIU/mL  Urinalysis, Routine w reflex microscopic  Result Value Ref Range   Specific Gravity, UA 1.020 1.005 - 1.030   pH, UA 5.5 5.0 - 7.5   Color, UA Yellow Yellow   Appearance Ur Clear Clear   Leukocytes, UA Negative Negative   Protein, UA Negative Negative/Trace   Glucose, UA Negative Negative   Ketones, UA Trace (A) Negative   RBC, UA Negative Negative   Bilirubin, UA Negative Negative   Urobilinogen, Ur 1.0 0.2 - 1.0 mg/dL   Nitrite, UA Negative Negative   Microscopic Examination See below:       Assessment & Plan:   Problem List Items Addressed This Visit      Cardiovascular and Mediastinum   Hypertension - Primary    The current medical regimen is effective;  continue present plan and medications. Observe blood pressure and if not coming down will give [place] a call to increase amlodipine.      Relevant Orders   Basic metabolic panel     Other   Hyperlipidemia    The current medical regimen is effective;  continue present plan and medications.       Insomnia    The current medical regimen is effective;  continue present plan and medications.       Relevant Medications   zaleplon (SONATA) 10 MG capsule       Follow up plan: Return in about 6 months (around 06/25/2018) for Physical Exam.

## 2017-12-27 ENCOUNTER — Encounter: Payer: Self-pay | Admitting: Family Medicine

## 2017-12-27 LAB — BASIC METABOLIC PANEL
BUN / CREAT RATIO: 17 (ref 9–20)
BUN: 22 mg/dL (ref 6–24)
CHLORIDE: 103 mmol/L (ref 96–106)
CO2: 24 mmol/L (ref 20–29)
Calcium: 9.9 mg/dL (ref 8.7–10.2)
Creatinine, Ser: 1.28 mg/dL — ABNORMAL HIGH (ref 0.76–1.27)
GFR calc non Af Amer: 61 mL/min/{1.73_m2} (ref >59)
GFR, EST AFRICAN AMERICAN: 70 mL/min/{1.73_m2} (ref >59)
Glucose: 106 mg/dL — ABNORMAL HIGH (ref 65–99)
Potassium: 4.2 mmol/L (ref 3.5–5.2)
Sodium: 143 mmol/L (ref 134–144)

## 2018-01-08 ENCOUNTER — Telehealth: Payer: Self-pay | Admitting: Family Medicine

## 2018-01-08 NOTE — Telephone Encounter (Signed)
Copied from [place] [id number]. Topic: Quick Communication - Rx Refill/Question >> Jan 08, 2018 10:52 AM Oliver Pila B wrote: Pt states he woke up w/ a burning throat, pt would like something called in for him(Rx),  pt's pcp is not in and there are not slots available today for appt's, contact pt to advise

## 2018-01-09 NOTE — Telephone Encounter (Signed)
Left message to call back to discuss symptoms.

## 2018-03-13 ENCOUNTER — Other Ambulatory Visit: Payer: Self-pay | Admitting: Family Medicine

## 2018-03-13 DIAGNOSIS — F419 Anxiety disorder, unspecified: Secondary | ICD-10-CM

## 2018-05-17 ENCOUNTER — Other Ambulatory Visit: Payer: Self-pay | Admitting: Family Medicine

## 2018-05-17 DIAGNOSIS — F419 Anxiety disorder, unspecified: Secondary | ICD-10-CM

## 2018-05-17 DIAGNOSIS — G4700 Insomnia, unspecified: Secondary | ICD-10-CM

## 2018-05-17 NOTE — Telephone Encounter (Signed)
Refill of Ativan  LRF 03/13/18  #30  1 refill  Refill of Sonata  LRF 12/26/17  #30  5 refills  LOV 12/26/17  Dr. Evalyn [facility]

## 2018-05-17 NOTE — Telephone Encounter (Signed)
Copied from [place] [id number]. Topic: Quick Communication - Rx Refill/Question >> May 17, 2018 12:23 PM Wynetta Emery, [place] C wrote: Medication: zaleplon (SONATA) 10 MG capsule  and also LORazepam (ATIVAN) 1 MG tablet  Has the patient contacted their pharmacy? Yes   (Agent: If no, request that the patient contact the pharmacy for the refill.) (Agent: If yes, when and what did the pharmacy advise?)  Preferred Pharmacy (with phone number or street name): [facility], [place]: Please be advised that RX refills may take up to 3 business days. We ask that you follow-up with your pharmacy.

## 2018-05-20 MED ORDER — ZALEPLON 10 MG PO CAPS: 10.0000 mg | ORAL_CAPSULE | Freq: Every evening | ORAL | 1 refills | Status: DC | PRN

## 2018-05-20 MED ORDER — LORAZEPAM 1 MG PO TABS: 1.0000 mg | ORAL_TABLET | Freq: Every day | ORAL | 1 refills | Status: DC | PRN

## 2018-07-02 ENCOUNTER — Ambulatory Visit (INDEPENDENT_AMBULATORY_CARE_PROVIDER_SITE_OTHER): Payer: BLUE CROSS/BLUE SHIELD | Admitting: Family Medicine

## 2018-07-02 ENCOUNTER — Encounter: Payer: Self-pay | Admitting: Family Medicine

## 2018-07-02 VITALS — BP 135/86 | HR 74 | Ht 69.49 in | Wt 190.0 lb

## 2018-07-02 DIAGNOSIS — Z23 Encounter for immunization: Secondary | ICD-10-CM

## 2018-07-02 DIAGNOSIS — F419 Anxiety disorder, unspecified: Secondary | ICD-10-CM

## 2018-07-02 DIAGNOSIS — C61 Malignant neoplasm of prostate: Secondary | ICD-10-CM

## 2018-07-02 DIAGNOSIS — I10 Essential (primary) hypertension: Secondary | ICD-10-CM | POA: Diagnosis not present

## 2018-07-02 DIAGNOSIS — G4700 Insomnia, unspecified: Secondary | ICD-10-CM

## 2018-07-02 DIAGNOSIS — Z1329 Encounter for screening for other suspected endocrine disorder: Secondary | ICD-10-CM

## 2018-07-02 DIAGNOSIS — E785 Hyperlipidemia, unspecified: Secondary | ICD-10-CM

## 2018-07-02 LAB — URINALYSIS, ROUTINE W REFLEX MICROSCOPIC
BILIRUBIN UA: NEGATIVE
GLUCOSE, UA: NEGATIVE
KETONES UA: NEGATIVE
Leukocytes, UA: NEGATIVE
Nitrite, UA: NEGATIVE
Protein, UA: NEGATIVE
RBC, UA: NEGATIVE
Specific Gravity, UA: <1.005 — ABNORMAL LOW (ref 1.005–1.030)
UUROB: 0.2 mg/dL (ref 0.2–1.0)
pH, UA: 5.5 (ref 5.0–7.5)

## 2018-07-02 MED ORDER — ZALEPLON 10 MG PO CAPS: 10.0000 mg | ORAL_CAPSULE | Freq: Every evening | ORAL | 1 refills | Status: DC | PRN

## 2018-07-02 MED ORDER — BENAZEPRIL HCL 40 MG PO TABS: 40.0000 mg | ORAL_TABLET | Freq: Every day | ORAL | 4 refills | Status: DC

## 2018-07-02 MED ORDER — AMLODIPINE BESYLATE 5 MG PO TABS: 5.0000 mg | ORAL_TABLET | Freq: Every day | ORAL | 4 refills | Status: DC

## 2018-07-02 MED ORDER — LORAZEPAM 1 MG PO TABS: 1.0000 mg | ORAL_TABLET | Freq: Every day | ORAL | 1 refills | Status: DC | PRN

## 2018-07-02 NOTE — Assessment & Plan Note (Signed)
Discussed chronic insomnia and use of Sonata.  We will continue Sonata with refills

## 2018-07-02 NOTE — Assessment & Plan Note (Signed)
The current medical regimen is effective;  continue present plan and medications.  

## 2018-07-02 NOTE — Progress Notes (Signed)
BP 135/86   Pulse 74   Ht 5' 9.49" (1.765 m)   Wt 190 lb (86.2 kg)   SpO2 98%   BMI 27.67 kg/m    Subjective:    Patient ID: Austin Wall, male    DOB: Jul 19, 1956, 62 y.o.   MRN: [id number]  HPI: Austin Wall is a 62 y.o. male  Chief Complaint  Patient presents with  . Annual Exam  Patient all in all doing well no complaints taking Benzapril amlodipine without problems and good control of blood pressure. Has had some elevated readings recently but otherwise been doing good. Taking lorazepam on a as needed basis does not take on a regular basis about 2 prescriptions a year is all he takes. Does take Sonata 10 mg every night has chronic insomnia since she is ever been a kid.  This prescription is been long-term.  Relevant past medical, surgical, family and social history reviewed and updated as indicated. Interim medical history since our last visit reviewed. Allergies and medications reviewed and updated.  Review of Systems  Constitutional: Negative.   HENT: Negative.   Eyes: Negative.   Respiratory: Negative.   Cardiovascular: Negative.   Gastrointestinal: Negative.   Endocrine: Negative.   Genitourinary: Negative.   Musculoskeletal: Negative.   Skin: Negative.   Allergic/Immunologic: Negative.   Neurological: Negative.   Hematological: Negative.   Psychiatric/Behavioral: Negative.     Per HPI unless specifically indicated above     Objective:    BP 135/86   Pulse 74   Ht 5' 9.49" (1.765 m)   Wt 190 lb (86.2 kg)   SpO2 98%   BMI 27.67 kg/m   Wt Readings from Last 3 Encounters:  07/02/18 190 lb (86.2 kg)  12/26/17 198 lb (89.8 kg)  06/25/17 201 lb (91.2 kg)    Physical Exam  Constitutional: He is oriented to person, place, and time. He appears well-developed and well-nourished.  HENT:  Head: Normocephalic and atraumatic.  Right Ear: External ear normal.  Left Ear: External ear normal.  Eyes: Pupils are equal, round, and reactive to light.  Conjunctivae and EOM are normal.  Neck: Normal range of motion. Neck supple.  Cardiovascular: Normal rate, regular rhythm, normal heart sounds and intact distal pulses.  Pulmonary/Chest: Effort normal and breath sounds normal.  Abdominal: Soft. Bowel sounds are normal. There is no splenomegaly or hepatomegaly.  Genitourinary: Rectum normal, prostate normal and penis normal.  Musculoskeletal: Normal range of motion.  Neurological: He is alert and oriented to person, place, and time. He has normal reflexes.  Skin: No rash noted. No erythema.  Psychiatric: He has a normal mood and affect. His behavior is normal. Judgment and thought content normal.    Results for orders placed or performed in visit on 44/96/75  Basic metabolic panel  Result Value Ref Range   Glucose 106 (H) 65 - 99 mg/dL   BUN 22 6 - 24 mg/dL   Creatinine, Ser 1.28 (H) 0.76 - 1.27 mg/dL   GFR calc non Af Amer 61 >59 mL/min/1.73   GFR calc Af Amer 70 >59 mL/min/1.73   BUN/Creatinine Ratio 17 9 - 20   Sodium 143 134 - 144 mmol/L   Potassium 4.2 3.5 - 5.2 mmol/L   Chloride 103 96 - 106 mmol/L   CO2 24 20 - 29 mmol/L   Calcium 9.9 8.7 - 10.2 mg/dL      Assessment & Plan:   Problem List Items Addressed This Visit  Cardiovascular and Mediastinum   Hypertension - Primary    The current medical regimen is effective;  continue present plan and medications.       Relevant Medications   benazepril (LOTENSIN) 40 MG tablet   amLODipine (NORVASC) 5 MG tablet   Other Relevant Orders   CBC with Differential/Platelet   Comprehensive metabolic panel   Lipid panel   Urinalysis, Routine w reflex microscopic     Genitourinary   Prostate CA (HCC)   Relevant Medications   LORazepam (ATIVAN) 1 MG tablet   Other Relevant Orders   PSA     Other   Hyperlipidemia    The current medical regimen is effective;  continue present plan and medications.       Relevant Medications   benazepril (LOTENSIN) 40 MG tablet    amLODipine (NORVASC) 5 MG tablet   Other Relevant Orders   CBC with Differential/Platelet   Comprehensive metabolic panel   Lipid panel   Urinalysis, Routine w reflex microscopic   Insomnia    Discussed chronic insomnia and use of Sonata.  We will continue Sonata with refills      Relevant Medications   zaleplon (SONATA) 10 MG capsule   Anxiety    Continue PRN lorazepam      Relevant Medications   LORazepam (ATIVAN) 1 MG tablet    Other Visit Diagnoses    Thyroid disorder screen       Relevant Orders   TSH   Needs flu shot       Relevant Orders   Flu Vaccine QUAD 6+ mos PF IM (Fluarix Quad PF) (Completed)       Follow up plan: Return in about 6 months (around 01/02/2019) for BMP.

## 2018-07-02 NOTE — Assessment & Plan Note (Signed)
Continue PRN lorazepam

## 2018-07-02 NOTE — Patient Instructions (Signed)

## 2018-07-03 ENCOUNTER — Encounter: Payer: Self-pay | Admitting: Family Medicine

## 2018-07-03 LAB — COMPREHENSIVE METABOLIC PANEL
ALBUMIN: 4.7 g/dL (ref 3.6–4.8)
ALK PHOS: 60 IU/L (ref 39–117)
ALT: 25 IU/L (ref 0–44)
AST: 20 IU/L (ref 0–40)
Albumin/Globulin Ratio: 1.9 (ref 1.2–2.2)
BILIRUBIN TOTAL: 0.5 mg/dL (ref 0.0–1.2)
BUN / CREAT RATIO: 19 (ref 10–24)
BUN: 20 mg/dL (ref 8–27)
CHLORIDE: 100 mmol/L (ref 96–106)
CO2: 25 mmol/L (ref 20–29)
CREATININE: 1.08 mg/dL (ref 0.76–1.27)
Calcium: 10.1 mg/dL (ref 8.6–10.2)
GFR calc Af Amer: 86 mL/min/{1.73_m2} (ref >59)
GFR calc non Af Amer: 74 mL/min/{1.73_m2} (ref >59)
GLOBULIN, TOTAL: 2.5 g/dL (ref 1.5–4.5)
GLUCOSE: 102 mg/dL — AB (ref 65–99)
Potassium: 4.2 mmol/L (ref 3.5–5.2)
SODIUM: 139 mmol/L (ref 134–144)
Total Protein: 7.2 g/dL (ref 6.0–8.5)

## 2018-07-03 LAB — PSA: PROSTATE SPECIFIC AG, SERUM: 0.2 ng/mL (ref 0.0–4.0)

## 2018-07-03 LAB — CBC WITH DIFFERENTIAL/PLATELET
BASOS: 0 %
Basophils Absolute: 0 10*3/uL (ref 0.0–0.2)
EOS (ABSOLUTE): 0.1 10*3/uL (ref 0.0–0.4)
EOS: 2 %
HEMOGLOBIN: 15.1 g/dL (ref 13.0–17.7)
Hematocrit: 46.2 % (ref 37.5–51.0)
Immature Grans (Abs): 0 10*3/uL (ref 0.0–0.1)
Immature Granulocytes: 0 %
LYMPHS: 22 %
Lymphocytes Absolute: 1.5 10*3/uL (ref 0.7–3.1)
MCH: 29.8 pg (ref 26.6–33.0)
MCHC: 32.7 g/dL (ref 31.5–35.7)
MCV: 91 fL (ref 79–97)
Monocytes Absolute: 0.7 10*3/uL (ref 0.1–0.9)
Monocytes: 11 %
NEUTROS PCT: 65 %
Neutrophils Absolute: 4.3 10*3/uL (ref 1.4–7.0)
Platelets: 210 10*3/uL (ref 150–450)
RBC: 5.07 x10E6/uL (ref 4.14–5.80)
RDW: 12.4 % (ref 12.3–15.4)
WBC: 6.6 10*3/uL (ref 3.4–10.8)

## 2018-07-03 LAB — LIPID PANEL
Chol/HDL Ratio: 4.6 ratio (ref 0.0–5.0)
Cholesterol, Total: 211 mg/dL — ABNORMAL HIGH (ref 100–199)
HDL: 46 mg/dL (ref >39)
LDL CALC: 127 mg/dL — AB (ref 0–99)
TRIGLYCERIDES: 192 mg/dL — AB (ref 0–149)
VLDL Cholesterol Cal: 38 mg/dL (ref 5–40)

## 2018-07-03 LAB — TSH: TSH: 1.42 u[IU]/mL (ref 0.450–4.500)

## 2018-07-03 NOTE — Addendum Note (Signed)
Addended by: Golden Pop A on: 07/03/2018 02:40 PM   Modules accepted: Orders

## 2018-10-31 ENCOUNTER — Encounter: Payer: Self-pay | Admitting: Family Medicine

## 2018-11-24 ENCOUNTER — Emergency Department
Admission: EM | Admit: 2018-11-24 | Discharge: 2018-11-24 | Disposition: A | Discharge status: Home / Self Care | Payer: BLUE CROSS/BLUE SHIELD | Attending: Emergency Medicine | Admitting: Emergency Medicine

## 2018-11-24 ENCOUNTER — Other Ambulatory Visit: Payer: Self-pay

## 2018-11-24 ENCOUNTER — Emergency Department: Payer: BLUE CROSS/BLUE SHIELD

## 2018-11-24 DIAGNOSIS — K529 Noninfective gastroenteritis and colitis, unspecified: Secondary | ICD-10-CM

## 2018-11-24 DIAGNOSIS — Z79899 Other long term (current) drug therapy: Secondary | ICD-10-CM | POA: Insufficient documentation

## 2018-11-24 DIAGNOSIS — K625 Hemorrhage of anus and rectum: Secondary | ICD-10-CM | POA: Diagnosis not present

## 2018-11-24 DIAGNOSIS — I10 Essential (primary) hypertension: Secondary | ICD-10-CM | POA: Diagnosis not present

## 2018-11-24 DIAGNOSIS — R102 Pelvic and perineal pain: Secondary | ICD-10-CM | POA: Diagnosis present

## 2018-11-24 DIAGNOSIS — Z87891 Personal history of nicotine dependence: Secondary | ICD-10-CM | POA: Diagnosis not present

## 2018-11-24 LAB — COMPREHENSIVE METABOLIC PANEL
ALBUMIN: 4.7 g/dL (ref 3.5–5.0)
ALT: 43 U/L (ref 0–44)
AST: 26 U/L (ref 15–41)
Alkaline Phosphatase: 70 U/L (ref 38–126)
Anion gap: 7 (ref 5–15)
BUN: 21 mg/dL — ABNORMAL HIGH (ref 6–20)
CO2: 27 mmol/L (ref 22–32)
Calcium: 9.6 mg/dL (ref 8.9–10.3)
Chloride: 104 mmol/L (ref 98–111)
Creatinine, Ser: 0.93 mg/dL (ref 0.61–1.24)
GFR calc Af Amer: >60 mL/min (ref >60)
GFR calc non Af Amer: >60 mL/min (ref >60)
GLUCOSE: 134 mg/dL — AB (ref 70–99)
Potassium: 3.9 mmol/L (ref 3.5–5.1)
Sodium: 138 mmol/L (ref 135–145)
Total Bilirubin: 0.7 mg/dL (ref 0.3–1.2)
Total Protein: 8.1 g/dL (ref 6.5–8.1)

## 2018-11-24 LAB — CBC
HCT: 48.1 % (ref 39.0–52.0)
Hemoglobin: 15.7 g/dL (ref 13.0–17.0)
MCH: 29.4 pg (ref 26.0–34.0)
MCHC: 32.6 g/dL (ref 30.0–36.0)
MCV: 90.1 fL (ref 80.0–100.0)
Platelets: 237 10*3/uL (ref 150–400)
RBC: 5.34 MIL/uL (ref 4.22–5.81)
RDW: 11.7 % (ref 11.5–15.5)
WBC: 13.3 10*3/uL — ABNORMAL HIGH (ref 4.0–10.5)
nRBC: 0 % (ref 0.0–0.2)

## 2018-11-24 LAB — TYPE AND SCREEN
ABO/RH(D): O POS
Antibody Screen: NEGATIVE

## 2018-11-24 MED ORDER — IOHEXOL 300 MG/ML  SOLN
100.0000 mL | Freq: Once | INTRAMUSCULAR | Status: AC | PRN
  Administered 2018-11-24: 100 mL via INTRAVENOUS

## 2018-11-24 MED ORDER — AZITHROMYCIN 500 MG PO TABS: 500.0000 mg | ORAL_TABLET | Freq: Every day | ORAL | 0 refills | Status: DC

## 2018-11-24 NOTE — Discharge Instructions (Addendum)
Please seek medical attention for any high fevers, chest pain, shortness of breath, change in behavior, persistent vomiting, bloody stool or any other new or concerning symptoms.  

## 2018-11-24 NOTE — ED Triage Notes (Signed)
States passing red blood in stool last night. States lower abd pain. Also c/o vomiting. Denies blood thinner use.   Denies hemorrhoid or diverticulitis or ulcers.    A&O, ambulatory. No distress noted.

## 2018-11-24 NOTE — ED Provider Notes (Signed)
[facility] Emergency Department Provider Note  ____________________________________________   I have reviewed the triage vital signs and the nursing notes.   HISTORY  Chief Complaint Rectal Bleeding   History limited by: Not Limited   HPI Austin Wall is a 63 y.o. male who presents to the emergency department today because of concern for rectal bleeding. Patient states that his symptoms started last night. Ate dinner and then roughly 1.5 hours later started having nausea and vomiting. Vomited roughly 3 times. After that started having diarrhea. States it has been bloody. Multiple episodes throughout the night. This has been accompanied by some lower abdominal discomfort. He denies any fevers. No one else at dinner got this sickness. Denies any history of ibs or similar disease. States he had a colonoscopy a little over 3 years ago and was told it was clean.    Per medical record review patient has a history of prostate cancer  Past Medical History:  Diagnosis Date  . Arthritis    left foot s/p trauma - 30 yrs ago  . Headache    stress  . Hyperlipidemia   . Hypertension   . Insomnia   . Prostate CA [facility])     Patient Active Problem List   Diagnosis Date Noted  . Anxiety 06/25/2017  . Strain of calf muscle 09/15/2016  . Calf swelling 09/13/2016  . Chronic hand pain, right 09/13/2016  . Arthritis 08/14/2016  . Prostate CA ([place]) 06/24/2015  . Hypertension 06/24/2015  . Insomnia 06/24/2015  . Hyperlipidemia     Past Surgical History:  Procedure Laterality Date  . CATARACT EXTRACTION Left 02/10/15   [facility]  . CATARACT EXTRACTION W/PHACO Right 04/07/2015   Procedure: CATARACT EXTRACTION PHACO AND INTRAOCULAR LENS PLACEMENT (IOC);  Surgeon: Leandrew Koyanagi, MD;  Location: [facility];  Service: Ophthalmology;  Laterality: Right;  [place]  . COLONOSCOPY    . EYE SURGERY    . MOUTH SURGERY     dental implants  . radiation treatment     . VASECTOMY      Prior to Admission medications   Medication Sig Start Date End Date Taking? Authorizing Provider  amLODipine (NORVASC) 5 MG tablet Take 1 tablet (5 mg total) by mouth daily. AM 07/02/18   Guadalupe Maple, MD  benazepril (LOTENSIN) 40 MG tablet Take 1 tablet (40 mg total) by mouth daily. AM 07/02/18   Guadalupe Maple, MD  KRILL OIL PO Take by mouth daily.    [provider]  LORazepam (ATIVAN) 1 MG tablet Take 1 tablet (1 mg total) by mouth daily as needed for anxiety. 07/02/18   Guadalupe Maple, MD  Multiple Vitamin (MULTIVITAMIN) capsule Take 1 capsule by mouth daily. AM    [provider]  zaleplon (SONATA) 10 MG capsule Take 1 capsule (10 mg total) by mouth at bedtime as needed. 07/02/18   Guadalupe Maple, MD    Allergies Codeine  Family History  Problem Relation Age of Onset  . Cancer Mother   . Hypertension Mother   . Hypertension Father     Social History Social History   Tobacco Use  . Smoking status: Former Smoker    Types: Cigarettes    Last attempt to quit: 06/24/1995    Years since quitting: 23.4  . Smokeless tobacco: Never Used  Substance Use Topics  . Alcohol use: Yes    Alcohol/week: 0.0 standard drinks    Comment: very little  . Drug use: No  Types: Marijuana    Review of Systems Constitutional: No fever/chills Eyes: No visual changes. ENT: No sore throat. Cardiovascular: Denies chest pain. Respiratory: Denies shortness of breath. Gastrointestinal: Positive for lower abdominal pain, nausea vomiting and diarrhea Genitourinary: Negative for dysuria. Musculoskeletal: Negative for back pain. Skin: Negative for rash. Neurological: Negative for headaches, focal weakness or numbness.  ____________________________________________   PHYSICAL EXAM:  VITAL SIGNS: ED Triage Vitals [11/24/18 1146]  Enc Vitals Group     BP 133/66     Pulse Rate 91     Resp 18     Temp 98 F (36.7 C)     Temp Source Oral     SpO2  99 %     Weight 194 lb (88 kg)     Height 5\' 10"  (1.778 m)     Head Circumference      Peak Flow      Pain Score 6   Constitutional: Alert and oriented.  Eyes: Conjunctivae are normal.  ENT      Head: Normocephalic and atraumatic.      Nose: No congestion/rhinnorhea.      Mouth/Throat: Mucous membranes are moist.      Neck: No stridor. Hematological/Lymphatic/Immunilogical: No cervical lymphadenopathy. Cardiovascular: Normal rate, regular rhythm.  No murmurs, rubs, or gallops.  Respiratory: Normal respiratory effort without tachypnea nor retractions. Breath sounds are clear and equal bilaterally. No wheezes/rales/rhonchi. Gastrointestinal: Soft and non tender. No rebound. No guarding.  Genitourinary: Deferred Musculoskeletal: Normal range of motion in all extremities. No lower extremity edema. Neurologic:  Normal speech and language. No gross focal neurologic deficits are appreciated.  Skin:  Skin is warm, dry and intact. No rash noted. Psychiatric: Mood and affect are normal. Speech and behavior are normal. Patient exhibits appropriate insight and judgment.  ____________________________________________    LABS (pertinent positives/negatives)  CBC wbc 13.3, hgb 15.7, plt 237 CMP wnl except glu 134, bun 21  ____________________________________________   EKG  None  ____________________________________________    RADIOLOGY  CT abd/pel Findings consistent with colitis  ____________________________________________   PROCEDURES  Procedures  ____________________________________________   INITIAL IMPRESSION / ASSESSMENT AND PLAN / ED COURSE  Pertinent labs & imaging results that were available during my care of the patient were reviewed by me and considered in my medical decision making (see chart for details).   Patient presented because of concern for gi bleed and some abdominal pain. Blood work with mild leukocytosis. CT was performed which was consistent with  colitis. At this time I think likely infectious colitis. Discussed the findings with the patient. He did not have any episodes of GI bleed while in the emergency department. Discussed expectant management with the patient.   ____________________________________________   FINAL CLINICAL IMPRESSION(S) / ED DIAGNOSES  Final diagnoses:  Rectal bleeding  Colitis     Note: This dictation was prepared with Dragon dictation. Any transcriptional errors that result from this process are unintentional     Nance Pear, MD 11/24/18 1605

## 2019-01-02 ENCOUNTER — Ambulatory Visit: Payer: BLUE CROSS/BLUE SHIELD | Admitting: Family Medicine

## 2019-01-07 ENCOUNTER — Ambulatory Visit: Payer: BLUE CROSS/BLUE SHIELD | Admitting: Family Medicine

## 2019-01-07 ENCOUNTER — Encounter: Payer: Self-pay | Admitting: Family Medicine

## 2019-01-07 VITALS — BP 118/78 | HR 90 | Temp 98.6°F | Wt 196.6 lb

## 2019-01-07 DIAGNOSIS — I10 Essential (primary) hypertension: Secondary | ICD-10-CM | POA: Diagnosis not present

## 2019-01-07 DIAGNOSIS — G4700 Insomnia, unspecified: Secondary | ICD-10-CM | POA: Diagnosis not present

## 2019-01-07 DIAGNOSIS — F419 Anxiety disorder, unspecified: Secondary | ICD-10-CM | POA: Diagnosis not present

## 2019-01-07 MED ORDER — ZALEPLON 10 MG PO CAPS: 10.0000 mg | ORAL_CAPSULE | Freq: Every evening | ORAL | 1 refills | Status: DC | PRN

## 2019-01-07 MED ORDER — LORAZEPAM 1 MG PO TABS: 1.0000 mg | ORAL_TABLET | Freq: Every day | ORAL | 1 refills | Status: DC | PRN

## 2019-01-07 NOTE — Assessment & Plan Note (Signed)
The current medical regimen is effective;  continue present plan and medications.  

## 2019-01-07 NOTE — Progress Notes (Signed)
BP 118/78 (BP Location: Left Arm)   Pulse 90   Temp 98.6 F (37 C) (Oral)   Wt 196 lb 9.6 oz (89.2 kg)   SpO2 98%   BMI 28.21 kg/m    Subjective:    Patient ID: Austin Wall, male    DOB: [birth year], 63 y.o.   MRN: [id number]  HPI: Austin Wall is a 63 y.o. male  Chief Complaint  Patient presents with  . Hypertension   Trigger finger looking for surgery soon BP doing well no problems with meds and good control Anxiety problems has been dx with colitis Takes takes lorazepam without problems uses on a as needed basis needs refill. Takes Sonata every night and also needs a refill. Relevant past medical, surgical, family and social history reviewed and updated as indicated. Interim medical history since our last visit reviewed. Allergies and medications reviewed and updated.  Review of Systems  Constitutional: Negative.   Respiratory: Negative.   Cardiovascular: Negative.     Per HPI unless specifically indicated above     Objective:    BP 118/78 (BP Location: Left Arm)   Pulse 90   Temp 98.6 F (37 C) (Oral)   Wt 196 lb 9.6 oz (89.2 kg)   SpO2 98%   BMI 28.21 kg/m   Wt Readings from Last 3 Encounters:  01/07/19 196 lb 9.6 oz (89.2 kg)  11/24/18 194 lb (88 kg)  07/02/18 190 lb (86.2 kg)    Physical Exam Constitutional:      Appearance: He is well-developed.  HENT:     Head: Normocephalic and atraumatic.  Eyes:     Conjunctiva/sclera: Conjunctivae normal.  Neck:     Musculoskeletal: Normal range of motion.  Cardiovascular:     Rate and Rhythm: Normal rate and regular rhythm.     Heart sounds: Normal heart sounds.  Pulmonary:     Effort: Pulmonary effort is normal.     Breath sounds: Normal breath sounds.  Musculoskeletal: Normal range of motion.  Skin:    Findings: No erythema.  Neurological:     Mental Status: He is alert and oriented to person, place, and time.  Psychiatric:        Behavior: Behavior normal.        Thought Content:  Thought content normal.        Judgment: Judgment normal.     Results for orders placed or performed during the hospital encounter of 11/24/18  Comprehensive metabolic panel  Result Value Ref Range   Sodium 138 135 - 145 mmol/L   Potassium 3.9 3.5 - 5.1 mmol/L   Chloride 104 98 - 111 mmol/L   CO2 27 22 - 32 mmol/L   Glucose, Bld 134 (H) 70 - 99 mg/dL   BUN 21 (H) 6 - 20 mg/dL   Creatinine, Ser 0.93 0.61 - 1.24 mg/dL   Calcium 9.6 8.9 - 10.3 mg/dL   Total Protein 8.1 6.5 - 8.1 g/dL   Albumin 4.7 3.5 - 5.0 g/dL   AST 26 15 - 41 U/L   ALT 43 0 - 44 U/L   Alkaline Phosphatase 70 38 - 126 U/L   Total Bilirubin 0.7 0.3 - 1.2 mg/dL   GFR calc non Af Amer >60 >60 mL/min   GFR calc Af Amer >60 >60 mL/min   Anion gap 7 5 - 15  CBC  Result Value Ref Range   WBC 13.3 (H) 4.0 - 10.5 K/uL   RBC 5.34 4.22 -  5.81 MIL/uL   Hemoglobin 15.7 13.0 - 17.0 g/dL   HCT 48.1 39.0 - 52.0 %   MCV 90.1 80.0 - 100.0 fL   MCH 29.4 26.0 - 34.0 pg   MCHC 32.6 30.0 - 36.0 g/dL   RDW 11.7 11.5 - 15.5 %   Platelets 237 150 - 400 K/uL   nRBC 0.0 0.0 - 0.2 %  Type and screen [place]  Result Value Ref Range   ABO/RH(D) O POS    Antibody Screen NEG    Sample Expiration      11/27/2018 Performed at [facility], [address], [place], [place] [address]       Assessment & Plan:   Problem List Items Addressed This Visit      Cardiovascular and Mediastinum   Hypertension - Primary    The current medical regimen is effective;  continue present plan and medications.       Relevant Orders   Basic metabolic panel     Other   Insomnia    The current medical regimen is effective;  continue present plan and medications.       Relevant Medications   zaleplon (SONATA) 10 MG capsule   Anxiety    The current medical regimen is effective;  continue present plan and medications.       Relevant Medications   LORazepam (ATIVAN) 1 MG tablet       Follow up  plan: Return in about 6 months (around 07/10/2019) for Physical Exam.

## 2019-01-08 ENCOUNTER — Encounter: Payer: Self-pay | Admitting: Family Medicine

## 2019-01-08 LAB — BASIC METABOLIC PANEL
BUN/Creatinine Ratio: 21 (ref 10–24)
BUN: 22 mg/dL (ref 8–27)
CO2: 25 mmol/L (ref 20–29)
Calcium: 10.3 mg/dL — ABNORMAL HIGH (ref 8.6–10.2)
Chloride: 102 mmol/L (ref 96–106)
Creatinine, Ser: 1.06 mg/dL (ref 0.76–1.27)
GFR calc Af Amer: 88 mL/min/{1.73_m2} (ref >59)
GFR calc non Af Amer: 76 mL/min/{1.73_m2} (ref >59)
Glucose: 104 mg/dL — ABNORMAL HIGH (ref 65–99)
POTASSIUM: 4.1 mmol/L (ref 3.5–5.2)
SODIUM: 140 mmol/L (ref 134–144)

## 2019-01-17 ENCOUNTER — Ambulatory Visit: Payer: BLUE CROSS/BLUE SHIELD | Admitting: Podiatry

## 2019-01-17 ENCOUNTER — Other Ambulatory Visit: Payer: Self-pay

## 2019-01-17 ENCOUNTER — Encounter: Payer: Self-pay | Admitting: Podiatry

## 2019-01-17 ENCOUNTER — Ambulatory Visit (INDEPENDENT_AMBULATORY_CARE_PROVIDER_SITE_OTHER): Payer: BLUE CROSS/BLUE SHIELD

## 2019-01-17 DIAGNOSIS — M722 Plantar fascial fibromatosis: Secondary | ICD-10-CM

## 2019-01-17 MED ORDER — METHYLPREDNISOLONE 4 MG PO TBPK: ORAL_TABLET | ORAL | 0 refills | Status: DC

## 2019-01-17 MED ORDER — MELOXICAM 15 MG PO TABS: 15.0000 mg | ORAL_TABLET | Freq: Every day | ORAL | 1 refills | Status: DC

## 2019-01-20 NOTE — Progress Notes (Signed)
   Subjective: 63 year old male presenting today as a new patient with a chief complaint of shooting pain of the right heel that began suddenly five days ago. He states the pain is worse in the morning when he first gets out of the bed. Walking and touching the heel increases the pain. He has been using a night splint and stretching for treatment. Patient is here for further evaluation and treatment.   Past Medical History:  Diagnosis Date  . Arthritis    left foot s/p trauma - 30 yrs ago  . Headache    stress  . Hyperlipidemia   . Hypertension   . Insomnia   . Prostate CA [facility])      Objective: Physical Exam General: The patient is alert and oriented x3 in no acute distress.  Dermatology: Skin is warm, dry and supple bilateral lower extremities. Negative for open lesions or macerations bilateral.   Vascular: Dorsalis Pedis and Posterior Tibial pulses palpable bilateral.  Capillary fill time is immediate to all digits.  Neurological: Epicritic and protective threshold intact bilateral.   Musculoskeletal: Tenderness to palpation to the plantar aspect of the right heel along the plantar fascia. All other joints range of motion within normal limits bilateral. Strength 5/5 in all groups bilateral.   Radiographic exam: Normal osseous mineralization. Joint spaces preserved. No fracture/dislocation/boney destruction. No other soft tissue abnormalities or radiopaque foreign bodies.   Assessment: 1. Plantar fasciitis right 2. Pain in right foot  Plan of Care:  1. Patient evaluated. Xrays reviewed.   2. Injection of 0.5cc Celestone soluspan injected into the right plantar fascia  3. Rx for Medrol Dose Pack placed 4. Rx for Meloxicam ordered for patient. 5. Instructed patient regarding therapies and modalities at home to alleviate symptoms.  6. New night splint dispensed.  7. Return to clinic in 4 weeks.     Edrick Kins, DPM [facility]  Dr. Edrick Kins, DPM     [address], [place] [address]                Office [phone]  Fax [phone]

## 2019-02-07 ENCOUNTER — Ambulatory Visit (INDEPENDENT_AMBULATORY_CARE_PROVIDER_SITE_OTHER): Payer: BLUE CROSS/BLUE SHIELD | Admitting: Podiatry

## 2019-02-07 ENCOUNTER — Encounter: Payer: Self-pay | Admitting: Podiatry

## 2019-02-07 ENCOUNTER — Other Ambulatory Visit: Payer: Self-pay

## 2019-02-07 VITALS — Temp 98.6°F

## 2019-02-07 DIAGNOSIS — M722 Plantar fascial fibromatosis: Secondary | ICD-10-CM

## 2019-02-07 NOTE — Progress Notes (Signed)
   Subjective: Patient presents for follow-up treatment evaluation regarding plantar fasciitis to the right foot.  Last visit on 01/17/2019 he received an anti-inflammatory injection and a prednisone pack with meloxicam to take after.  He has noticed significant improvement.  He has been wearing his plantar fascial brace during the day with a night splint at night.  He presents for follow-up treatment evaluation   Past Medical History:  Diagnosis Date  . Arthritis    left foot s/p trauma - 30 yrs ago  . Headache    stress  . Hyperlipidemia   . Hypertension   . Insomnia   . Prostate CA [facility])      Objective: Physical Exam General: The patient is alert and oriented x3 in no acute distress.  Dermatology: Skin is warm, dry and supple bilateral lower extremities. Negative for open lesions or macerations bilateral.   Vascular: Dorsalis Pedis and Posterior Tibial pulses palpable bilateral.  Capillary fill time is immediate to all digits.  Neurological: Epicritic and protective threshold intact bilateral.   Musculoskeletal: Tenderness to palpation to the plantar aspect of the right heel along the plantar fascia, however this is improved since last visit. All other joints range of motion within normal limits bilateral. Strength 5/5 in all groups bilateral.   Assessment: 1. Plantar fasciitis right-improved 2. Pain in right foot  Plan of Care:  1. Patient evaluated. 2. Injection of 0.5cc Celestone soluspan injected into the right plantar fascia  3.  Continue meloxicam daily as needed 4.  Continue night splint as well as a plantar fascial brace during the day 5.  Continue wearing good supportive shoes 6.  Stressed the importance of daily stretching exercises to alleviate symptoms 7.  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 Edrick Kins, DPM    [address], [place] [address]                Office [phone]  Fax [phone]

## 2019-03-19 ENCOUNTER — Other Ambulatory Visit: Payer: Self-pay | Admitting: Podiatry

## 2019-03-24 ENCOUNTER — Other Ambulatory Visit: Payer: Self-pay

## 2019-03-24 ENCOUNTER — Ambulatory Visit: Payer: BLUE CROSS/BLUE SHIELD | Admitting: Podiatry

## 2019-03-24 VITALS — Temp 97.9°F

## 2019-03-24 DIAGNOSIS — M722 Plantar fascial fibromatosis: Secondary | ICD-10-CM

## 2019-03-26 NOTE — Progress Notes (Signed)
   Subjective: 63 year old male presenting today for follow up evaluation of plantar fasciitis of the right foot. He states the pain has not improved since his last visit. He reports going to the beach last weekend and the pain has worsened since then. He reports the last course of steroids he took did not provide any significant relief. Patient is here for further evaluation and treatment.   Past Medical History:  Diagnosis Date  . Arthritis    left foot s/p trauma - 30 yrs ago  . Headache    stress  . Hyperlipidemia   . Hypertension   . Insomnia   . Prostate CA [facility])      Objective: Physical Exam General: The patient is alert and oriented x3 in no acute distress.  Dermatology: Skin is warm, dry and supple bilateral lower extremities. Negative for open lesions or macerations bilateral.   Vascular: Dorsalis Pedis and Posterior Tibial pulses palpable bilateral.  Capillary fill time is immediate to all digits.  Neurological: Epicritic and protective threshold intact bilateral.   Musculoskeletal: Tenderness to palpation to the plantar aspect of the right heel along the plantar fascia, however this is improved since last visit. All other joints range of motion within normal limits bilateral. Strength 5/5 in all groups bilateral.   Assessment: 1. Plantar fasciitis right-improved 2. Pain in right foot  Plan of Care:  1. Patient evaluated. 2. Injection of 0.5cc Celestone soluspan injected into the right plantar fascia  3. Fitted for custom orthotics today by Liliane Channel, Pedorthist.  4. Return to clinic in 4 weeks.   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 Edrick Kins, DPM    [address], [place] [address]                Office [phone]  Fax [phone]

## 2019-04-16 ENCOUNTER — Other Ambulatory Visit: Payer: Self-pay

## 2019-04-16 ENCOUNTER — Ambulatory Visit: Payer: BLUE CROSS/BLUE SHIELD | Admitting: Orthotics

## 2019-04-16 DIAGNOSIS — M722 Plantar fascial fibromatosis: Secondary | ICD-10-CM

## 2019-04-16 NOTE — Progress Notes (Signed)
Patient came in today to pick up custom made foot orthotics.  The goals were accomplished and the patient reported no dissatisfaction with said orthotics.  Patient was advised of breakin period and how to report any issues. 

## 2019-04-25 ENCOUNTER — Ambulatory Visit: Payer: BLUE CROSS/BLUE SHIELD | Admitting: Podiatry

## 2019-06-13 ENCOUNTER — Other Ambulatory Visit: Payer: Self-pay | Admitting: Family Medicine

## 2019-06-13 DIAGNOSIS — F419 Anxiety disorder, unspecified: Secondary | ICD-10-CM

## 2019-06-13 NOTE — Telephone Encounter (Signed)
Please advise 

## 2019-06-13 NOTE — Telephone Encounter (Signed)
Routing to provider, patient last seen 01/07/19 and has f/up 07/10/19.

## 2019-07-09 ENCOUNTER — Other Ambulatory Visit: Payer: Self-pay | Admitting: Family Medicine

## 2019-07-09 DIAGNOSIS — G4700 Insomnia, unspecified: Secondary | ICD-10-CM

## 2019-07-09 NOTE — Telephone Encounter (Signed)
Routing to provider  

## 2019-07-09 NOTE — Telephone Encounter (Signed)
Requested medication (s) are due for refill today: yes  Requested medication (s) are on the active medication list: yes  Last refill:  04/14/2019  Future visit scheduled: ues  Notes to clinic:  This refill cannot be delegated   Requested Prescriptions  Pending Prescriptions Disp Refills   zaleplon (SONATA) 10 MG capsule [Pharmacy Med Name: ZALEPLON 10 MG CAPSULE] 90 capsule     Sig: Take 1 capsule (10 mg total) by mouth at bedtime as needed.     Not Delegated - Psychiatry:  Anxiolytics/Hypnotics Failed - 07/09/2019  2:54 PM      Failed - This refill cannot be delegated      Failed - Urine Drug Screen completed in last 360 days.      Failed - Valid encounter within last 6 months    Recent Outpatient Visits          6 months ago Essential hypertension   [place] Criss[place], Jeannette How, MD   1 year ago Essential hypertension   [place], Jeannette How, MD   1 year ago Essential hypertension   [place], Jeannette How, MD   2 years ago Essential hypertension   [place], Jeannette How, MD   2 years ago Essential hypertension   [place], Jeannette How, MD      Future Appointments            Tomorrow Crissman, Jeannette How, MD [facility]

## 2019-07-10 ENCOUNTER — Other Ambulatory Visit: Payer: Self-pay | Admitting: Family Medicine

## 2019-07-10 ENCOUNTER — Other Ambulatory Visit: Payer: Self-pay

## 2019-07-10 ENCOUNTER — Ambulatory Visit (INDEPENDENT_AMBULATORY_CARE_PROVIDER_SITE_OTHER): Payer: BLUE CROSS/BLUE SHIELD | Admitting: Family Medicine

## 2019-07-10 ENCOUNTER — Encounter: Payer: Self-pay | Admitting: Family Medicine

## 2019-07-10 VITALS — BP 128/77 | Wt 200.0 lb

## 2019-07-10 DIAGNOSIS — I10 Essential (primary) hypertension: Secondary | ICD-10-CM | POA: Diagnosis not present

## 2019-07-10 DIAGNOSIS — G4700 Insomnia, unspecified: Secondary | ICD-10-CM

## 2019-07-10 DIAGNOSIS — F419 Anxiety disorder, unspecified: Secondary | ICD-10-CM | POA: Diagnosis not present

## 2019-07-10 DIAGNOSIS — C61 Malignant neoplasm of prostate: Secondary | ICD-10-CM

## 2019-07-10 DIAGNOSIS — Z0000 Encounter for general adult medical examination without abnormal findings: Secondary | ICD-10-CM

## 2019-07-10 LAB — URINALYSIS, ROUTINE W REFLEX MICROSCOPIC
Bilirubin, UA: NEGATIVE
Glucose, UA: NEGATIVE
Ketones, UA: NEGATIVE
Leukocytes,UA: NEGATIVE
Nitrite, UA: NEGATIVE
Protein,UA: NEGATIVE
RBC, UA: NEGATIVE
Specific Gravity, UA: 1.015 (ref 1.005–1.030)
Urobilinogen, Ur: 0.2 mg/dL (ref 0.2–1.0)
pH, UA: 5 (ref 5.0–7.5)

## 2019-07-10 MED ORDER — AMLODIPINE BESYLATE 5 MG PO TABS: 5.0000 mg | ORAL_TABLET | Freq: Every day | ORAL | 4 refills | Status: DC

## 2019-07-10 MED ORDER — BENAZEPRIL HCL 40 MG PO TABS: 40.0000 mg | ORAL_TABLET | Freq: Every day | ORAL | 4 refills | Status: DC

## 2019-07-10 MED ORDER — ZALEPLON 10 MG PO CAPS: 10.0000 mg | ORAL_CAPSULE | Freq: Every evening | ORAL | 1 refills | Status: DC | PRN

## 2019-07-10 NOTE — Assessment & Plan Note (Signed)
Occasional use of lorazepam

## 2019-07-10 NOTE — Assessment & Plan Note (Signed)
The current medical regimen is effective;  continue present plan and medications.  

## 2019-07-10 NOTE — Assessment & Plan Note (Signed)
Discussed insomnia concerns will continue Zyloprim

## 2019-07-10 NOTE — Assessment & Plan Note (Signed)
Stable no sx

## 2019-07-10 NOTE — Progress Notes (Signed)
BP 128/77   Wt 200 lb (90.7 kg)   BMI 28.70 kg/m    Subjective:    Patient ID: Austin Wall, male    DOB: [birth year], 63 y.o.   MRN: [id number]  HPI: Austin Wall is a 63 y.o. male  Med check Foot pain ongoing with plantar fasciitis treating with podiatry. Discussed new shoes not going barefooted. Hypertension doing well without problems taking medications faithfully and good control. 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 Anxiety, takes occasional lorazepam for stormy days.  Understands limited use.   Relevant past medical, surgical, family and social history reviewed and updated as indicated. Interim medical history since our last visit reviewed. Allergies and medications reviewed and updated.  Review of Systems  Constitutional: Negative.   HENT: Negative.   Eyes: Negative.   Respiratory: Negative.   Cardiovascular: Negative.   Gastrointestinal: Negative.   Endocrine: Negative.   Genitourinary: Negative.   Musculoskeletal: Negative.   Skin: Negative.   Allergic/Immunologic: Negative.   Neurological: Negative.   Hematological: Negative.   Psychiatric/Behavioral: Negative.     Per HPI unless specifically indicated above     Objective:    BP 128/77   Wt 200 lb (90.7 kg)   BMI 28.70 kg/m   Wt Readings from Last 3 Encounters:  07/10/19 200 lb (90.7 kg)  01/07/19 196 lb 9.6 oz (89.2 kg)  11/24/18 194 lb (88 kg)    Physical Exam  Results for orders placed or performed in visit on [id number]  Basic metabolic panel  Result Value Ref Range   Glucose 104 (H) 65 - 99 mg/dL   BUN 22 8 - 27 mg/dL   Creatinine, Ser 1.06 0.76 - 1.27 mg/dL   GFR calc non Af Amer 76 >59 mL/min/1.73   GFR calc Af Amer 88 >59 mL/min/1.73   BUN/Creatinine Ratio 21 10 - 24   Sodium 140 134 - 144 mmol/L   Potassium 4.1 3.5 - 5.2 mmol/L   Chloride 102 96 - 106 mmol/L   CO2 25 20 - 29  mmol/L   Calcium 10.3 (H) 8.6 - 10.2 mg/dL      Assessment & Plan:   Problem List Items Addressed This Visit      Cardiovascular and Mediastinum   Hypertension    The current medical regimen is effective;  continue present plan and medications.       Relevant Medications   benazepril (LOTENSIN) 40 MG tablet   amLODipine (NORVASC) 5 MG tablet     Genitourinary   Prostate CA (HCC)    Stable no sx        Other   Insomnia    Discussed insomnia concerns will continue Zyloprim      Anxiety    Occasional use of lorazepam       Other Visit Diagnoses    PE (physical exam), annual    -  Primary   Relevant Orders   Comprehensive metabolic panel   Lipid panel   CBC with Differential/Platelet   TSH   Urinalysis, Routine w reflex microscopic   PSA      Telemedicine using audio/video telecommunications for a synchronous communication visit. Today's visit due to COVID-19 isolation precautions I connected with and verified that I am speaking with the correct person using two identifiers.   I discussed the limitations, risks, security and privacy concerns of performing an evaluation  and management service by telecommunication and the availability of in person appointments. I also discussed with the patient that there may be a patient responsible charge related to this service. The patient expressed understanding and agreed to proceed. The patient's location is home. I am at home.   I discussed the assessment and treatment plan with the patient. The patient was provided an opportunity to ask questions and all were answered. The patient agreed with the plan and demonstrated an understanding of the instructions.   The patient was advised to call back or seek an in-person evaluation if the symptoms worsen or if the condition fails to improve as anticipated.   I provided 21+ minutes of time during this encounter.  Follow up plan: Return for Physical Exam in office setting with  63-month follow-up.

## 2019-07-11 LAB — COMPREHENSIVE METABOLIC PANEL
ALT: 71 IU/L — ABNORMAL HIGH (ref 0–44)
AST: 49 IU/L — ABNORMAL HIGH (ref 0–40)
Albumin/Globulin Ratio: 1.9 (ref 1.2–2.2)
Albumin: 4.8 g/dL (ref 3.8–4.8)
Alkaline Phosphatase: 61 IU/L (ref 39–117)
BUN/Creatinine Ratio: 14 (ref 10–24)
BUN: 16 mg/dL (ref 8–27)
Bilirubin Total: 0.5 mg/dL (ref 0.0–1.2)
CO2: 23 mmol/L (ref 20–29)
Calcium: 10.1 mg/dL (ref 8.6–10.2)
Chloride: 103 mmol/L (ref 96–106)
Creatinine, Ser: 1.12 mg/dL (ref 0.76–1.27)
GFR calc Af Amer: 82 mL/min/{1.73_m2} (ref >59)
GFR calc non Af Amer: 71 mL/min/{1.73_m2} (ref >59)
Globulin, Total: 2.5 g/dL (ref 1.5–4.5)
Glucose: 82 mg/dL (ref 65–99)
Potassium: 4.4 mmol/L (ref 3.5–5.2)
Sodium: 140 mmol/L (ref 134–144)
Total Protein: 7.3 g/dL (ref 6.0–8.5)

## 2019-07-11 LAB — CBC WITH DIFFERENTIAL/PLATELET
Basophils Absolute: 0.1 10*3/uL (ref 0.0–0.2)
Basos: 1 %
EOS (ABSOLUTE): 0.1 10*3/uL (ref 0.0–0.4)
Eos: 1 %
Hematocrit: 48.6 % (ref 37.5–51.0)
Hemoglobin: 15.9 g/dL (ref 13.0–17.7)
Immature Grans (Abs): 0 10*3/uL (ref 0.0–0.1)
Immature Granulocytes: 0 %
Lymphocytes Absolute: 1.6 10*3/uL (ref 0.7–3.1)
Lymphs: 23 %
MCH: 29.8 pg (ref 26.6–33.0)
MCHC: 32.7 g/dL (ref 31.5–35.7)
MCV: 91 fL (ref 79–97)
Monocytes Absolute: 0.7 10*3/uL (ref 0.1–0.9)
Monocytes: 11 %
Neutrophils Absolute: 4.4 10*3/uL (ref 1.4–7.0)
Neutrophils: 64 %
Platelets: 222 10*3/uL (ref 150–450)
RBC: 5.33 x10E6/uL (ref 4.14–5.80)
RDW: 12 % (ref 11.6–15.4)
WBC: 6.8 10*3/uL (ref 3.4–10.8)

## 2019-07-11 LAB — LIPID PANEL
Chol/HDL Ratio: 4.4 ratio (ref 0.0–5.0)
Cholesterol, Total: 221 mg/dL — ABNORMAL HIGH (ref 100–199)
HDL: 50 mg/dL (ref >39)
LDL Chol Calc (NIH): 147 mg/dL — ABNORMAL HIGH (ref 0–99)
Triglycerides: 134 mg/dL (ref 0–149)
VLDL Cholesterol Cal: 24 mg/dL (ref 5–40)

## 2019-07-11 LAB — PSA: Prostate Specific Ag, Serum: 0.3 ng/mL (ref 0.0–4.0)

## 2019-07-11 LAB — TSH: TSH: 1.5 u[IU]/mL (ref 0.450–4.500)

## 2019-07-14 ENCOUNTER — Encounter: Payer: Self-pay | Admitting: Family Medicine

## 2019-08-12 ENCOUNTER — Encounter: Payer: Self-pay | Admitting: Family Medicine

## 2019-08-25 ENCOUNTER — Encounter: Payer: Self-pay | Admitting: Family Medicine

## 2020-04-01 ENCOUNTER — Emergency Department: Payer: 59

## 2020-04-01 ENCOUNTER — Encounter: Payer: Self-pay | Admitting: Emergency Medicine

## 2020-04-01 ENCOUNTER — Other Ambulatory Visit: Payer: Self-pay

## 2020-04-01 ENCOUNTER — Inpatient Hospital Stay
Admission: EM | Admit: 2020-04-01 | Discharge: 2020-04-03 | DRG: 247 | Disposition: A | Discharge status: Home / Self Care | Payer: 59 | Attending: Internal Medicine | Admitting: Internal Medicine

## 2020-04-01 DIAGNOSIS — Z87891 Personal history of nicotine dependence: Secondary | ICD-10-CM

## 2020-04-01 DIAGNOSIS — Z8249 Family history of ischemic heart disease and other diseases of the circulatory system: Secondary | ICD-10-CM

## 2020-04-01 DIAGNOSIS — I214 Non-ST elevation (NSTEMI) myocardial infarction: Principal | ICD-10-CM | POA: Diagnosis present

## 2020-04-01 DIAGNOSIS — E785 Hyperlipidemia, unspecified: Secondary | ICD-10-CM | POA: Diagnosis present

## 2020-04-01 DIAGNOSIS — R001 Bradycardia, unspecified: Secondary | ICD-10-CM | POA: Diagnosis not present

## 2020-04-01 DIAGNOSIS — I10 Essential (primary) hypertension: Secondary | ICD-10-CM | POA: Diagnosis present

## 2020-04-01 DIAGNOSIS — I2510 Atherosclerotic heart disease of native coronary artery without angina pectoris: Secondary | ICD-10-CM | POA: Diagnosis not present

## 2020-04-01 DIAGNOSIS — R079 Chest pain, unspecified: Secondary | ICD-10-CM

## 2020-04-01 DIAGNOSIS — I200 Unstable angina: Secondary | ICD-10-CM | POA: Diagnosis present

## 2020-04-01 DIAGNOSIS — I25110 Atherosclerotic heart disease of native coronary artery with unstable angina pectoris: Secondary | ICD-10-CM | POA: Diagnosis present

## 2020-04-01 DIAGNOSIS — Z20822 Contact with and (suspected) exposure to covid-19: Secondary | ICD-10-CM | POA: Diagnosis present

## 2020-04-01 DIAGNOSIS — R778 Other specified abnormalities of plasma proteins: Secondary | ICD-10-CM | POA: Diagnosis not present

## 2020-04-01 DIAGNOSIS — Z8546 Personal history of malignant neoplasm of prostate: Secondary | ICD-10-CM

## 2020-04-01 LAB — CBC
HCT: 45.3 % (ref 39.0–52.0)
Hemoglobin: 14.8 g/dL (ref 13.0–17.0)
MCH: 30 pg (ref 26.0–34.0)
MCHC: 32.7 g/dL (ref 30.0–36.0)
MCV: 91.7 fL (ref 80.0–100.0)
Platelets: 205 10*3/uL (ref 150–400)
RBC: 4.94 MIL/uL (ref 4.22–5.81)
RDW: 12.4 % (ref 11.5–15.5)
WBC: 7.9 10*3/uL (ref 4.0–10.5)
nRBC: 0 % (ref 0.0–0.2)

## 2020-04-01 LAB — BASIC METABOLIC PANEL
Anion gap: 8 (ref 5–15)
BUN: 20 mg/dL (ref 8–23)
CO2: 28 mmol/L (ref 22–32)
Calcium: 9.7 mg/dL (ref 8.9–10.3)
Chloride: 104 mmol/L (ref 98–111)
Creatinine, Ser: 0.95 mg/dL (ref 0.61–1.24)
GFR calc Af Amer: >60 mL/min (ref >60)
GFR calc non Af Amer: >60 mL/min (ref >60)
Glucose, Bld: 111 mg/dL — ABNORMAL HIGH (ref 70–99)
Potassium: 3.8 mmol/L (ref 3.5–5.1)
Sodium: 140 mmol/L (ref 135–145)

## 2020-04-01 LAB — SARS CORONAVIRUS 2 BY RT PCR (HOSPITAL ORDER, PERFORMED IN ~~LOC~~ HOSPITAL LAB): SARS Coronavirus 2: NEGATIVE

## 2020-04-01 LAB — T4, FREE: Free T4: 0.76 ng/dL (ref 0.61–1.12)

## 2020-04-01 LAB — PROTIME-INR
INR: 1 (ref 0.8–1.2)
Prothrombin Time: 12.7 seconds (ref 11.4–15.2)

## 2020-04-01 LAB — TSH: TSH: 1.433 u[IU]/mL (ref 0.350–4.500)

## 2020-04-01 LAB — TROPONIN I (HIGH SENSITIVITY)
Troponin I (High Sensitivity): 1926 ng/L (ref <18)
Troponin I (High Sensitivity): 1602 ng/L (ref <18)

## 2020-04-01 LAB — APTT: aPTT: 25 seconds (ref 24–36)

## 2020-04-01 MED ORDER — ASPIRIN 81 MG PO CHEW
324.0000 mg | CHEWABLE_TABLET | ORAL | Status: AC
  Administered 2020-04-01: 324 mg via ORAL
  Filled 2020-04-01: qty 4

## 2020-04-01 MED ORDER — ACETAMINOPHEN 325 MG PO TABS: 650.0000 mg | ORAL_TABLET | ORAL | Status: DC | PRN

## 2020-04-01 MED ORDER — BENAZEPRIL HCL 20 MG PO TABS
40.0000 mg | ORAL_TABLET | Freq: Every day | ORAL | Status: DC
  Administered 2020-04-03: 40 mg via ORAL
  Filled 2020-04-01 (×3): qty 2

## 2020-04-01 MED ORDER — ATORVASTATIN CALCIUM 20 MG PO TABS
40.0000 mg | ORAL_TABLET | Freq: Every day | ORAL | Status: DC
  Administered 2020-04-01 – 2020-04-02 (×2): 40 mg via ORAL
  Filled 2020-04-01 (×2): qty 2

## 2020-04-01 MED ORDER — NITROGLYCERIN 0.4 MG SL SUBL: 0.4000 mg | SUBLINGUAL_TABLET | SUBLINGUAL | Status: DC | PRN

## 2020-04-01 MED ORDER — HEPARIN BOLUS VIA INFUSION
4000.0000 [IU] | Freq: Once | INTRAVENOUS | Status: AC
  Administered 2020-04-01: 4000 [IU] via INTRAVENOUS
  Filled 2020-04-01: qty 4000

## 2020-04-01 MED ORDER — ONDANSETRON HCL 4 MG/2ML IJ SOLN
4.0000 mg | Freq: Four times a day (QID) | INTRAMUSCULAR | Status: DC | PRN
  Administered 2020-04-02: 4 mg via INTRAVENOUS
  Filled 2020-04-01: qty 2

## 2020-04-01 MED ORDER — ASPIRIN EC 81 MG PO TBEC
81.0000 mg | DELAYED_RELEASE_TABLET | Freq: Every day | ORAL | Status: DC
  Administered 2020-04-03: 81 mg via ORAL
  Filled 2020-04-01: qty 1

## 2020-04-01 MED ORDER — ASPIRIN 300 MG RE SUPP: 300.0000 mg | RECTAL | Status: AC

## 2020-04-01 MED ORDER — HEPARIN (PORCINE) 25000 UT/250ML-% IV SOLN
1250.0000 [IU]/h | INTRAVENOUS | Status: DC
  Administered 2020-04-01: 1250 [IU]/h via INTRAVENOUS
  Filled 2020-04-01: qty 250

## 2020-04-01 MED ORDER — SODIUM CHLORIDE 0.9 % IV SOLN: INTRAVENOUS | Status: DC

## 2020-04-01 NOTE — H&P (Signed)
History and Physical    CHED NEDROW [id number] DOB: 12-13-1955 DOA: 04/01/2020   PCP: Dion Body, MD   Outpatient Specialists: [facility]  Patient coming from: Dr.Kowealski's, office.   Chief Complaint:  Chest pain.  HPI: IASON DANH is a 64 y.o. male with medical history significant of HTN, Hyperlipdiemia, Seen in ed today for chest pain.pt was at pcp office for his annual physical and told him reported chest discomfort , 3 weeks  EKG was abnormal and was sent to cardiology.  Chest pain is first time episode and when it started 3 weeks ago walking up hill and located midsternal and first time it was 8 /10, it lasted about 20 minutes and rested with 2 aspirin and Rested and chest pain resolved. Then since then he notices the pattern has been intermittent and unrelated to rest or exercise,and some times has woke him up from sleep. +sob no nausea or vomiting/ no diaphoresis . Since 3 weeks chest pain has been random and unpredictable. Pt did have chest pain at his appt today and was given sl ntg the chest pain was resolved with ntg. NR pain is localized, pain is pressure tightness.left arm numbness has been happening since past few  Days.  ED Course:  Blood pressure 139/74, pulse 60, temperature 98.4 F (36.9 C), temperature source Oral, resp. rate 20, height 5\' 10"  (1.778 m), weight 90.7 kg, SpO2 96 %. Pt was found to have elevated troponin of 1926 and has been started on heparin drip with initial bolus. Covid-19 is done and pending however pt has no symptoms of pulmonary issues. He has had his covid-19 shots.   Review of Systems: As per HPI otherwise 10 point review of systems negative.   Past Medical History:  Diagnosis Date  . Arthritis    left foot s/p trauma - 30 yrs ago  . Headache    stress  . Hyperlipidemia   . Hypertension   . Insomnia   . Prostate CA [facility])     Past Surgical History:  Procedure Laterality Date  . CATARACT EXTRACTION Left 02/10/15    [facility]  . CATARACT EXTRACTION W/PHACO Right 04/07/2015   Procedure: CATARACT EXTRACTION PHACO AND INTRAOCULAR LENS PLACEMENT (IOC);  Surgeon: Leandrew Koyanagi, MD;  Location: [place];  Service: Ophthalmology;  Laterality: Right;  [place]  . COLONOSCOPY    . EYE SURGERY    . MOUTH SURGERY     dental implants  . radiation treatment    . VASECTOMY       reports that he quit smoking about 24 years ago. His smoking use included cigarettes. He has never used smokeless tobacco. He reports current alcohol use. He reports that he does not use drugs.  Allergies  Allergen Reactions  . Codeine Nausea Only    Family History  Problem Relation Age of Onset  . Cancer Mother   . Hypertension Mother   . Hypertension Father     Prior to Admission medications   Medication Sig Start Date End Date Taking? Authorizing Provider  amLODipine (NORVASC) 5 MG tablet Take 1 tablet (5 mg total) by mouth daily. AM 07/10/19   Guadalupe Maple, MD  benazepril (LOTENSIN) 40 MG tablet Take 1 tablet (40 mg total) by mouth daily. AM 07/10/19   Guadalupe Maple, MD  KRILL OIL PO Take by mouth daily.    [provider]  LORazepam (ATIVAN) 1 MG tablet TAKE 1 TABLET BY MOUTH DAILY AS NEEDED FOR ANXIETY 06/16/19  Guadalupe Maple, MD  meloxicam (MOBIC) 15 MG tablet TAKE 1 TABLET BY MOUTH EVERY DAY 03/19/19   Edrick Kins, DPM  Multiple Vitamin (MULTIVITAMIN) capsule Take 1 capsule by mouth daily. AM    [provider]  zaleplon (SONATA) 10 MG capsule Take 1 capsule (10 mg total) by mouth at bedtime as needed. 07/10/19   Guadalupe Maple, MD   Physical Exam: Vitals:   04/01/20 1634 04/01/20 1641  BP:  139/74  Pulse:  60  Resp:  20  Temp:  98.4 F (36.9 C)  TempSrc:  Oral  SpO2:  96%  Weight: 90.7 kg   Height: 5\' 10"  (1.778 m)    Constitutional: NAD, calm, comfortable Vitals:   04/01/20 1634 04/01/20 1641  BP:  139/74  Pulse:  60  Resp:  20  Temp:  98.4 F (36.9 C)  TempSrc:   Oral  SpO2:  96%  Weight: 90.7 kg   Height: 5\' 10"  (1.778 m)    Eyes: PERRL, lids and conjunctivae normal ENMT: Mucous membranes are moist. Posterior pharynx clear of any exudate or lesions.Normal dentition.  Neck: normal, supple, no masses, no thyromegaly Respiratory: clear to auscultation bilaterally, no wheezing, no crackles. Normal respiratory effort. No accessory muscle use.  Cardiovascular: Regular rate and rhythm, no murmurs / rubs / gallops. No extremity edema. 2+ pedal pulses. No carotid bruits.  Abdomen: no tenderness, no masses palpated. No hepatosplenomegaly. Bowel sounds positive.  Musculoskeletal: no clubbing / cyanosis. No joint deformity upper and lower extremities. Good ROM, no contractures. Normal muscle tone.  Skin: no rashes, lesions, ulcers. No induration Neurologic: CN 2-12 grossly intact. Sensation intact, DTR normal. Strength 5/5 in all 4.  Psychiatric: Normal judgment and insight. Alert and oriented x 3. Normal mood.   Labs on Admission: I have personally reviewed following labs and imaging studies  CBC: Recent Labs  Lab 04/01/20 1649  WBC 7.9  HGB 14.8  HCT 45.3  MCV 91.7  PLT 99991111   Basic Metabolic Panel: Recent Labs  Lab 04/01/20 1649  NA 140  K 3.8  CL 104  CO2 28  GLUCOSE 111*  BUN 20  CREATININE 0.95  CALCIUM 9.7   GFR: Estimated Creatinine Clearance: 91.3 mL/min (by C-G formula based on SCr of 0.95 mg/dL). Liver Function Tests: No results for input(s): AST, ALT, ALKPHOS, BILITOT, PROT, ALBUMIN in the last 168 hours. No results for input(s): LIPASE, AMYLASE in the last 168 hours. No results for input(s): AMMONIA in the last 168 hours. Coagulation Profile: Recent Labs  Lab 04/01/20 1800  INR 1.0   Cardiac Enzymes: No results for input(s): CKTOTAL, CKMB, CKMBINDEX, TROPONINI in the last 168 hours. BNP (last 3 results) No results for input(s): PROBNP in the last 8760 hours. HbA1C: No results for input(s): HGBA1C in the last 72  hours. CBG: No results for input(s): GLUCAP in the last 168 hours. Lipid Profile: No results for input(s): CHOL, HDL, LDLCALC, TRIG, CHOLHDL, LDLDIRECT in the last 72 hours. Thyroid Function Tests: No results for input(s): TSH, T4TOTAL, FREET4, T3FREE, THYROIDAB in the last 72 hours. Anemia Panel: No results for input(s): VITAMINB12, FOLATE, FERRITIN, TIBC, IRON, RETICCTPCT in the last 72 hours. Urine analysis:    Component Value Date/Time   APPEARANCEUR Clear 07/10/2019 0848   GLUCOSEU Negative 07/10/2019 0848   BILIRUBINUR Negative 07/10/2019 0848   PROTEINUR Negative 07/10/2019 0848   NITRITE Negative 07/10/2019 0848   LEUKOCYTESUR Negative 07/10/2019 0848    Radiological Exams on Admission: DG  Chest 2 View  Result Date: 04/01/2020 CLINICAL DATA:  Chest pressure and pain that began yesterday, hypertension EXAM: CHEST - 2 VIEW COMPARISON:  None. FINDINGS: The heart size and mediastinal contours are within normal limits. Both lungs are clear. The visualized skeletal structures are unremarkable. IMPRESSION: No active cardiopulmonary disease. Electronically Signed   By: Randa Ngo M.D.   On: 04/01/2020 17:18    EKG: Independently reviewed. Sinus rhythm at 64 with left atrial enlargement large P waves in lead 2.5 mm, T wave inversion in V1 V2 which is new from EKG in 2009.    Assessment/Plan Principal Problem:   NSTEMI (non-ST elevated myocardial infarction) ([place]) Active Problems:   Unstable angina ([place])   Hypertension  UA/ NSTEMI: -Admit to cardiac unit with continuous telemetry monitoring.  -Cont asa/ add Lipitor 40 mg am lipid panel / cont benazepril  prn ntg/ oxygen and morphine and  Therapeutic diose heparin for anticoagulation.  -Cardiac diet and npo after midnight. -Cardiology consult with plan for cath in am.   HTN: -cont amlodipine and ACEI, BB therapy not initiated due to heart rate of 60's.   DVT prophylaxis: Therapeutic dose anticoagulation for  NSTEMI. Code Status: Full Family Communication: Vaughan Basta ( wife ) [phone]. Disposition Plan: Home Consults called: Cardiology Admission status:  Inpatient  Para Skeans MD [facility] If 7PM-7AM, please contact night-coverage [email or web address] Password [email or web address]  04/01/2020, 7:00 PM

## 2020-04-01 NOTE — ED Triage Notes (Signed)
Pt reports heavy, pressure like chest pain. Pt states went to [facility] and was advised to come to the ED. Pt is scheduled to go to the cath lab with Dr. Nehemiah Massed.

## 2020-04-01 NOTE — ED Notes (Signed)
PT states going to PCP today and was told to come to the ER after pt had EKG. Pt states chest pain. Pt is resting in room on cardiac, bp and pulse ox monitor. Pt states chest pain 2/10 and a headache 6/10.

## 2020-04-01 NOTE — Consult Note (Signed)
[place] for Heparin Indication: chest pain/ACS  Allergies  Allergen Reactions  . Codeine Nausea Only    Patient Measurements: Height: 5\' 10"  (177.8 cm) Weight: 90.7 kg (200 lb) IBW/kg (Calculated) : 73 Heparin Dosing Weight: 90.7 kg  Vital Signs: Temp: 98.4 F (36.9 C) (05/27 1641) Temp Source: Oral (05/27 1641) BP: 139/74 (05/27 1641) Pulse Rate: 60 (05/27 1641)  Labs: Recent Labs    04/01/20 1649  HGB 14.8  HCT 45.3  PLT 205    CrCl cannot be calculated (Patient's most recent lab result is older than the maximum 21 days allowed.).   Medical History: Past Medical History:  Diagnosis Date  . Arthritis    left foot s/p trauma - 30 yrs ago  . Headache    stress  . Hyperlipidemia   . Hypertension   . Insomnia   . Prostate CA ([place])     Medications:  (Not in a hospital admission)  Scheduled:  Infusions:  PRN:  Anti-infectives (From admission, onward)   None      Assessment: Pharmacy consulted for heparin for ACS. No DOAC PTA.   Goal of Therapy:  Heparin level 0.3-0.7 units/ml Monitor platelets by anticoagulation protocol: Yes   Plan:  Give 4000 units bolus x 1 Start heparin infusion at 1250 units/hr Check anti-Xa level in 6 hours and daily while on heparin Continue to monitor H&H and platelets  Oswald Hillock, PharmD, BCPS 04/01/2020,5:28 PM

## 2020-04-01 NOTE — ED Provider Notes (Signed)
[facility] Emergency Department Provider Note  ____________________________________________   I have reviewed the triage vital signs and the nursing notes.   HISTORY  Chief Complaint Chest Pain   History limited by: Not Limited   HPI Austin Wall is a 64 y.o. male who presents to the emergency department today from cardiologist office because of concerns for chest pain.  Patient states pain is been present for the past 3 weeks.  Located in the center chest.  Described as pressure-like.  Did first noticed it when he was walking up at home.  He has had some associated shortness of breath.  Patient denies similar symptoms in the past.  Was at his scheduled at primary care doctor's office today when he discussed his pain.  PCP arrange for him to see cardiology.  At cardiologist office did have concerns for ACS and requested he come to the emergency department for admission and catheterization.  Records reviewed. Per medical record review patient has a history of HLD, HTN.  Past Medical History:  Diagnosis Date  . Arthritis    left foot s/p trauma - 30 yrs ago  . Headache    stress  . Hyperlipidemia   . Hypertension   . Insomnia   . Prostate CA [facility])     Patient Active Problem List   Diagnosis Date Noted  . Anxiety 06/25/2017  . Chronic hand pain, right 09/13/2016  . Arthritis 08/14/2016  . Prostate CA ([place]) 06/24/2015  . Hypertension 06/24/2015  . Insomnia 06/24/2015  . Hyperlipidemia     Past Surgical History:  Procedure Laterality Date  . CATARACT EXTRACTION Left 02/10/15   [facility]  . CATARACT EXTRACTION W/PHACO Right 04/07/2015   Procedure: CATARACT EXTRACTION PHACO AND INTRAOCULAR LENS PLACEMENT (IOC);  Surgeon: Leandrew Koyanagi, MD;  Location: [place];  Service: Ophthalmology;  Laterality: Right;  [place]  . COLONOSCOPY    . EYE SURGERY    . MOUTH SURGERY     dental implants  . radiation treatment    . VASECTOMY       Prior to Admission medications   Medication Sig Start Date End Date Taking? Authorizing Provider  amLODipine (NORVASC) 5 MG tablet Take 1 tablet (5 mg total) by mouth daily. AM 07/10/19   Guadalupe Maple, MD  benazepril (LOTENSIN) 40 MG tablet Take 1 tablet (40 mg total) by mouth daily. AM 07/10/19   Guadalupe Maple, MD  KRILL OIL PO Take by mouth daily.    [provider]  LORazepam (ATIVAN) 1 MG tablet TAKE 1 TABLET BY MOUTH DAILY AS NEEDED FOR ANXIETY 06/16/19   Guadalupe Maple, MD  meloxicam (MOBIC) 15 MG tablet TAKE 1 TABLET BY MOUTH EVERY DAY 03/19/19   Edrick Kins, DPM  Multiple Vitamin (MULTIVITAMIN) capsule Take 1 capsule by mouth daily. AM    [provider]  zaleplon (SONATA) 10 MG capsule Take 1 capsule (10 mg total) by mouth at bedtime as needed. 07/10/19   Guadalupe Maple, MD    Allergies Codeine  Family History  Problem Relation Age of Onset  . Cancer Mother   . Hypertension Mother   . Hypertension Father     Social History Social History   Tobacco Use  . Smoking status: Former Smoker    Types: Cigarettes    Quit date: 06/24/1995    Years since quitting: 24.7  . Smokeless tobacco: Never Used  Substance Use Topics  . Alcohol use: Yes  Alcohol/week: 0.0 standard drinks    Comment: very little  . Drug use: No    Types: Marijuana    Review of Systems Constitutional: No fever/chills Eyes: No visual changes. ENT: No sore throat. Cardiovascular: Positive for chest pain. Respiratory: Positive for shortness of breath. Gastrointestinal: No abdominal pain.  No nausea, no vomiting.  No diarrhea.   Genitourinary: Negative for dysuria. Musculoskeletal: Negative for back pain. Skin: Negative for rash. Neurological: Negative for headaches, focal weakness or numbness.  ____________________________________________   PHYSICAL EXAM:  VITAL SIGNS: ED Triage Vitals  Enc Vitals Group     BP 04/01/20 1641 139/74     Pulse Rate 04/01/20 1641 60      Resp 04/01/20 1641 20     Temp 04/01/20 1641 98.4 F (36.9 C)     Temp Source 04/01/20 1641 Oral     SpO2 04/01/20 1641 96 %     Weight 04/01/20 1634 200 lb (90.7 kg)     Height 04/01/20 1634 5\' 10"  (1.778 m)     Head Circumference --      Peak Flow --      Pain Score 04/01/20 1634 0   Constitutional: Alert and oriented.  Eyes: Conjunctivae are normal.  ENT      Head: Normocephalic and atraumatic.      Nose: No congestion/rhinnorhea.      Mouth/Throat: Mucous membranes are moist.      Neck: No stridor. Hematological/Lymphatic/Immunilogical: No cervical lymphadenopathy. Cardiovascular: Normal rate, regular rhythm.  No murmurs, rubs, or gallops. Respiratory: Normal respiratory effort without tachypnea nor retractions. Breath sounds are clear and equal bilaterally. No wheezes/rales/rhonchi. Gastrointestinal: Soft and non tender. No rebound. No guarding.  Genitourinary: Deferred Musculoskeletal: Normal range of motion in all extremities. No lower extremity edema. Neurologic:  Normal speech and language. No gross focal neurologic deficits are appreciated.  Skin:  Skin is warm, dry and intact. No rash noted. Psychiatric: Mood and affect are normal. Speech and behavior are normal. Patient exhibits appropriate insight and judgment.  ____________________________________________    LABS (pertinent positives/negatives)  CBC wbc 7.9, hgb 14.8, plt 205 BMP wnl except glu 111 Trop hs 1926 ____________________________________________   EKG  I, Nance Pear, attending physician, personally viewed and interpreted this EKG  EKG Time: 1639 Rate: 64 Rhythm: normal sinus rhythm Axis: normal Intervals: qtc 427 QRS: narrow, q waves v1, v2 ST changes: no st elevation Impression: abnormal ekg   ____________________________________________    RADIOLOGY  CXR No acute  abnromality  ____________________________________________   PROCEDURES  Procedures  ____________________________________________   INITIAL IMPRESSION / ASSESSMENT AND PLAN / ED COURSE  Pertinent labs & imaging results that were available during my care of the patient were reviewed by me and considered in my medical decision making (see chart for details).   Patient presents to the emergency department today from cardiologist office because of concerns for chest pain. Says been going on for the past few weeks. At the time my exam patient states he is pain-free after having nitroglycerin. Discussed with Dr. Nehemiah Massed with cardiology. He is planning on catheterization tomorrow morning. Will plan on admission. Discussed plan with patient.   ____________________________________________   FINAL CLINICAL IMPRESSION(S) / ED DIAGNOSES  Final diagnoses:  Chest pain, unspecified type  Elevated troponin     Note: This dictation was prepared with Dragon dictation. Any transcriptional errors that result from this process are unintentional     Nance Pear, MD 04/01/20 1744

## 2020-04-02 ENCOUNTER — Encounter: Payer: Self-pay | Admitting: Internal Medicine

## 2020-04-02 ENCOUNTER — Other Ambulatory Visit: Payer: Self-pay

## 2020-04-02 ENCOUNTER — Encounter
Admission: EM | Disposition: A | Discharge status: Home / Self Care | Payer: Self-pay | Source: Home / Self Care | Attending: Internal Medicine

## 2020-04-02 DIAGNOSIS — I2510 Atherosclerotic heart disease of native coronary artery without angina pectoris: Secondary | ICD-10-CM

## 2020-04-02 DIAGNOSIS — I214 Non-ST elevation (NSTEMI) myocardial infarction: Principal | ICD-10-CM

## 2020-04-02 HISTORY — PX: CORONARY STENT INTERVENTION: CATH118234

## 2020-04-02 HISTORY — PX: LEFT HEART CATH AND CORONARY ANGIOGRAPHY: CATH118249

## 2020-04-02 LAB — LIPID PANEL
Cholesterol: 270 mg/dL — ABNORMAL HIGH (ref 0–200)
HDL: 50 mg/dL (ref >40)
LDL Cholesterol: 183 mg/dL — ABNORMAL HIGH (ref 0–99)
Total CHOL/HDL Ratio: 5.4 RATIO
Triglycerides: 187 mg/dL — ABNORMAL HIGH (ref <150)
VLDL: 37 mg/dL (ref 0–40)

## 2020-04-02 LAB — BASIC METABOLIC PANEL
Anion gap: 7 (ref 5–15)
BUN: 19 mg/dL (ref 8–23)
CO2: 28 mmol/L (ref 22–32)
Calcium: 9.7 mg/dL (ref 8.9–10.3)
Chloride: 105 mmol/L (ref 98–111)
Creatinine, Ser: 0.99 mg/dL (ref 0.61–1.24)
GFR calc Af Amer: >60 mL/min (ref >60)
GFR calc non Af Amer: >60 mL/min (ref >60)
Glucose, Bld: 122 mg/dL — ABNORMAL HIGH (ref 70–99)
Potassium: 3.8 mmol/L (ref 3.5–5.1)
Sodium: 140 mmol/L (ref 135–145)

## 2020-04-02 LAB — CBC
HCT: 44.1 % (ref 39.0–52.0)
Hemoglobin: 15 g/dL (ref 13.0–17.0)
MCH: 30.4 pg (ref 26.0–34.0)
MCHC: 34 g/dL (ref 30.0–36.0)
MCV: 89.3 fL (ref 80.0–100.0)
Platelets: 195 10*3/uL (ref 150–400)
RBC: 4.94 MIL/uL (ref 4.22–5.81)
RDW: 12.6 % (ref 11.5–15.5)
WBC: 8.7 10*3/uL (ref 4.0–10.5)
nRBC: 0 % (ref 0.0–0.2)

## 2020-04-02 LAB — POCT ACTIVATED CLOTTING TIME: Activated Clotting Time: 230 seconds

## 2020-04-02 LAB — HEPARIN LEVEL (UNFRACTIONATED)
Heparin Unfractionated: 0.44 IU/mL (ref 0.30–0.70)
Heparin Unfractionated: 0.47 IU/mL (ref 0.30–0.70)

## 2020-04-02 LAB — HEMOGLOBIN A1C
Hgb A1c MFr Bld: 6 % — ABNORMAL HIGH (ref 4.8–5.6)
Mean Plasma Glucose: 125.5 mg/dL

## 2020-04-02 LAB — TROPONIN I (HIGH SENSITIVITY): Troponin I (High Sensitivity): 1443 ng/L (ref <18)

## 2020-04-02 SURGERY — LEFT HEART CATH AND CORONARY ANGIOGRAPHY
Anesthesia: Moderate Sedation

## 2020-04-02 MED ORDER — TIROFIBAN HCL IV 12.5 MG/250 ML
INTRAVENOUS | Status: AC
  Filled 2020-04-02: qty 250

## 2020-04-02 MED ORDER — NITROGLYCERIN 1 MG/10 ML FOR IR/CATH LAB
INTRA_ARTERIAL | Status: AC
  Filled 2020-04-02: qty 10

## 2020-04-02 MED ORDER — PAROXETINE HCL 20 MG PO TABS
20.0000 mg | ORAL_TABLET | Freq: Every day | ORAL | Status: DC
  Administered 2020-04-02: 20 mg via ORAL
  Filled 2020-04-02 (×2): qty 1

## 2020-04-02 MED ORDER — TICAGRELOR 90 MG PO TABS
90.0000 mg | ORAL_TABLET | Freq: Two times a day (BID) | ORAL | Status: DC
  Administered 2020-04-02 – 2020-04-03 (×2): 90 mg via ORAL
  Filled 2020-04-02 (×2): qty 1

## 2020-04-02 MED ORDER — HEPARIN (PORCINE) IN NACL 1000-0.9 UT/500ML-% IV SOLN
INTRAVENOUS | Status: DC | PRN
  Administered 2020-04-02: 500 mL

## 2020-04-02 MED ORDER — IOHEXOL 300 MG/ML  SOLN
INTRAMUSCULAR | Status: DC | PRN
  Administered 2020-04-02: 150 mL
  Administered 2020-04-02: 70 mL

## 2020-04-02 MED ORDER — ASPIRIN 81 MG PO CHEW: 81.0000 mg | CHEWABLE_TABLET | ORAL | Status: DC

## 2020-04-02 MED ORDER — FENTANYL CITRATE (PF) 100 MCG/2ML IJ SOLN
INTRAMUSCULAR | Status: DC | PRN
  Administered 2020-04-02 (×2): 50 ug via INTRAVENOUS

## 2020-04-02 MED ORDER — SODIUM CHLORIDE 0.9% FLUSH: 3.0000 mL | INTRAVENOUS | Status: DC | PRN

## 2020-04-02 MED ORDER — SODIUM CHLORIDE 0.9% FLUSH
3.0000 mL | Freq: Two times a day (BID) | INTRAVENOUS | Status: DC
  Administered 2020-04-02: 3 mL via INTRAVENOUS

## 2020-04-02 MED ORDER — TICAGRELOR 90 MG PO TABS
ORAL_TABLET | ORAL | Status: AC
  Filled 2020-04-02: qty 2

## 2020-04-02 MED ORDER — SODIUM CHLORIDE 0.9 % WEIGHT BASED INFUSION: 1.0000 mL/kg/h | INTRAVENOUS | Status: DC

## 2020-04-02 MED ORDER — TIROFIBAN (AGGRASTAT) BOLUS VIA INFUSION
INTRAVENOUS | Status: DC | PRN
  Administered 2020-04-02: 2267.5 ug via INTRAVENOUS

## 2020-04-02 MED ORDER — VERAPAMIL HCL 2.5 MG/ML IV SOLN
INTRAVENOUS | Status: AC
  Filled 2020-04-02: qty 2

## 2020-04-02 MED ORDER — SODIUM CHLORIDE 0.9% FLUSH: 3.0000 mL | Freq: Two times a day (BID) | INTRAVENOUS | Status: DC

## 2020-04-02 MED ORDER — NITROGLYCERIN 0.4 MG SL SUBL
SUBLINGUAL_TABLET | SUBLINGUAL | Status: DC | PRN
  Administered 2020-04-02: .4 mg via SUBLINGUAL

## 2020-04-02 MED ORDER — FENTANYL CITRATE (PF) 100 MCG/2ML IJ SOLN
INTRAMUSCULAR | Status: AC
  Filled 2020-04-02: qty 2

## 2020-04-02 MED ORDER — MIDAZOLAM HCL 2 MG/2ML IJ SOLN
INTRAMUSCULAR | Status: AC
  Filled 2020-04-02: qty 2

## 2020-04-02 MED ORDER — TIROFIBAN HCL IV 12.5 MG/250 ML
INTRAVENOUS | Status: DC | PRN
  Administered 2020-04-02: 0.15 ug/kg/min via INTRAVENOUS

## 2020-04-02 MED ORDER — LABETALOL HCL 5 MG/ML IV SOLN: 10.0000 mg | INTRAVENOUS | Status: AC | PRN

## 2020-04-02 MED ORDER — NITROGLYCERIN 1 MG/10 ML FOR IR/CATH LAB
INTRA_ARTERIAL | Status: DC | PRN
  Administered 2020-04-02 (×2): 200 ug via INTRACORONARY

## 2020-04-02 MED ORDER — HEPARIN (PORCINE) IN NACL 1000-0.9 UT/500ML-% IV SOLN
INTRAVENOUS | Status: AC
  Filled 2020-04-02: qty 1000

## 2020-04-02 MED ORDER — NITROGLYCERIN 0.4 MG SL SUBL
SUBLINGUAL_TABLET | SUBLINGUAL | Status: AC
  Filled 2020-04-02: qty 1

## 2020-04-02 MED ORDER — HYDRALAZINE HCL 20 MG/ML IJ SOLN: 10.0000 mg | INTRAMUSCULAR | Status: AC | PRN

## 2020-04-02 MED ORDER — SODIUM CHLORIDE 0.9 % IV SOLN: 250.0000 mL | INTRAVENOUS | Status: DC | PRN

## 2020-04-02 MED ORDER — VERAPAMIL HCL 2.5 MG/ML IV SOLN
INTRAVENOUS | Status: DC | PRN
  Administered 2020-04-02: 2.5 mg via INTRA_ARTERIAL

## 2020-04-02 MED ORDER — HEPARIN SODIUM (PORCINE) 1000 UNIT/ML IJ SOLN
INTRAMUSCULAR | Status: AC
  Filled 2020-04-02: qty 1

## 2020-04-02 MED ORDER — MIDAZOLAM HCL 2 MG/2ML IJ SOLN
INTRAMUSCULAR | Status: DC | PRN
  Administered 2020-04-02 (×2): 1 mg via INTRAVENOUS

## 2020-04-02 MED ORDER — HEPARIN SODIUM (PORCINE) 1000 UNIT/ML IJ SOLN
INTRAMUSCULAR | Status: DC | PRN
  Administered 2020-04-02: 5000 [IU] via INTRAVENOUS
  Administered 2020-04-02: 4500 [IU] via INTRAVENOUS

## 2020-04-02 MED ORDER — ENOXAPARIN SODIUM 40 MG/0.4ML ~~LOC~~ SOLN
40.0000 mg | SUBCUTANEOUS | Status: DC
  Administered 2020-04-03: 40 mg via SUBCUTANEOUS
  Filled 2020-04-02 (×2): qty 0.4

## 2020-04-02 MED ORDER — SODIUM CHLORIDE 0.9 % WEIGHT BASED INFUSION: 3.0000 mL/kg/h | INTRAVENOUS | Status: DC

## 2020-04-02 SURGICAL SUPPLY — 19 items
BALLN TREK RX 2.5X15 (BALLOONS) ×2
BALLN ~~LOC~~ EUPHORA RX 3.25X20 (BALLOONS) ×2
BALLN ~~LOC~~ TREK RX 3.75X12 (BALLOONS) ×2
BALLOON TREK RX 2.5X15 (BALLOONS) ×1 IMPLANT
BALLOON ~~LOC~~ EUPHORA RX 3.25X20 (BALLOONS) ×1 IMPLANT
BALLOON ~~LOC~~ TREK RX 3.75X12 (BALLOONS) ×1 IMPLANT
CATH INFINITI 5 FR JL3.5 (CATHETERS) ×2 IMPLANT
CATH INFINITI 5FR ANG PIGTAIL (CATHETERS) ×2 IMPLANT
CATH INFINITI JR4 5F (CATHETERS) ×2 IMPLANT
CATH LAUNCHER 6FR EBU3.5 (CATHETERS) ×2 IMPLANT
DEVICE INFLAT 30 PLUS (MISCELLANEOUS) ×2 IMPLANT
DEVICE RAD TR BAND REGULAR (VASCULAR PRODUCTS) ×2 IMPLANT
GLIDESHEATH SLEND SS 6F .021 (SHEATH) ×2 IMPLANT
GUIDEWIRE INQWIRE 1.5J.035X260 (WIRE) ×1 IMPLANT
INQWIRE 1.5J .035X260CM (WIRE) ×2
KIT MANI 3VAL PERCEP (MISCELLANEOUS) ×2 IMPLANT
PACK CARDIAC CATH (CUSTOM PROCEDURE TRAY) ×2 IMPLANT
STENT RESOLUTE ONYX 3.0X30 (Permanent Stent) ×2 IMPLANT
WIRE RUNTHROUGH .014X180CM (WIRE) ×2 IMPLANT

## 2020-04-02 NOTE — ED Notes (Signed)
EKG completed. ED provider saw EKG and signed it and Admit provider notified that EKG was exported to chart for review

## 2020-04-02 NOTE — ED Notes (Signed)
Verified with pharmacy the heparin dose and rate. Pharmacy stated to keep it at the current rate and dose. Please see MAR for more information.

## 2020-04-02 NOTE — Progress Notes (Signed)
[facility] Encounter Note  Patient: Austin Wall / Admit Date: 04/01/2020 / Date of Encounter: 04/02/2020, 3:54 PM   Subjective: No further chest pain shortness of breath or other significant cardiovascular symptoms.  Patient has had elevation of troponin most consistent with non-ST elevation myocardial infarction with anterior T wave inversions.  The patient underwent PCI and stent placement of left anterior descending artery due to a 99% stenosis in the proximal portion of the left anterior descending artery.  No significant complications  Review of Systems: Positive for: None Negative for: Vision change, hearing change, syncope, dizziness, nausea, vomiting,diarrhea, bloody stool, stomach pain, cough, congestion, diaphoresis, urinary frequency, urinary pain,skin lesions, skin rashes Others previously listed  Objective: Telemetry: Normal sinus rhythm Physical Exam: Blood pressure 128/86, pulse 61, temperature 98.3 F (36.8 C), temperature source Oral, resp. rate 16, height 5\' 10"  (1.778 m), weight 90.7 kg, SpO2 98 %. Body mass index is 28.69 kg/m. General: Well developed, well nourished, in no acute distress. Head: Normocephalic, atraumatic, sclera non-icteric, no xanthomas, nares are without discharge. Neck: No apparent masses Lungs: Normal respirations with no wheezes, no rhonchi, no rales , no crackles   Heart: Regular rate and rhythm, normal S1 S2, no murmur, no rub, no gallop, PMI is normal size and placement, carotid upstroke normal without bruit, jugular venous pressure normal Abdomen: Soft, non-tender, non-distended with normoactive bowel sounds. No hepatosplenomegaly. Abdominal aorta is normal size without bruit Extremities: No edema, no clubbing, no cyanosis, no ulcers,  Peripheral: 2+ radial, 2+ femoral, 2+ dorsal pedal pulses Neuro: Alert and oriented. Moves all extremities spontaneously. Psych:  Responds to questions appropriately with a normal  affect.   Intake/Output Summary (Last 24 hours) at 04/02/2020 1554 Last data filed at 04/02/2020 1100 Gross per 24 hour  Intake 300 ml  Output 600 ml  Net -300 ml    Inpatient Medications:  . aspirin EC  81 mg Oral Daily  . atorvastatin  40 mg Oral q1800  . benazepril  40 mg Oral Daily  . [START ON 04/03/2020] enoxaparin (LOVENOX) injection  40 mg Subcutaneous Q24H  . sodium chloride flush  3 mL Intravenous Q12H  . sodium chloride flush  3 mL Intravenous Q12H  . ticagrelor  90 mg Oral BID   Infusions:  . sodium chloride 10 mL/hr at 04/01/20 2016  . sodium chloride      Labs: Recent Labs    04/01/20 1649 04/02/20 0504  NA 140 140  K 3.8 3.8  CL 104 105  CO2 28 28  GLUCOSE 111* 122*  BUN 20 19  CREATININE 0.95 0.99  CALCIUM 9.7 9.7   No results for input(s): AST, ALT, ALKPHOS, BILITOT, PROT, ALBUMIN in the last 72 hours. Recent Labs    04/01/20 1649 04/02/20 0504  WBC 7.9 8.7  HGB 14.8 15.0  HCT 45.3 44.1  MCV 91.7 89.3  PLT 205 195   No results for input(s): CKTOTAL, CKMB, TROPONINI in the last 72 hours. Invalid input(s): POCBNP Recent Labs    04/01/20 1836  HGBA1C 6.0*     Weights: Filed Weights   04/01/20 1634 04/02/20 0753  Weight: 90.7 kg 90.7 kg     Radiology/Studies:  DG Chest 2 View  Result Date: 04/01/2020 CLINICAL DATA:  Chest pressure and pain that began yesterday, hypertension EXAM: CHEST - 2 VIEW COMPARISON:  None. FINDINGS: The heart size and mediastinal contours are within normal limits. Both lungs are clear. The visualized skeletal structures are unremarkable. IMPRESSION: No  active cardiopulmonary disease. Electronically Signed   By: Randa Ngo M.D.   On: 04/01/2020 17:18   CARDIAC CATHETERIZATION  Result Date: 04/02/2020 Conclusions: 1. Multivessel CAD, as detailed in Dr. Alveria Apley diagnostic catheterization note. 2. Successful PCI to 99% proximal/mid LAD stenosis using Resolute Onyx 3.0 x 30 mm drug-eluting stent with 0%  residual stenosis and TIMI-3 flow.  Recommendations: 1. Dual antiplatelet therapy with aspirin and ticagrelor for at least 12 months. 2. Aggressive secondary prevention. 3. Consider staged PCI to RCA versus medical therapy and outpatient ischemia evaluation of RCA/LCx disease. Nelva Bush, MD [facility]   CARDIAC CATHETERIZATION  Result Date: 04/02/2020  Prox RCA lesion is 65% stenosed.  Prox RCA to Mid RCA lesion is 50% stenosed.  Mid RCA lesion is 70% stenosed.  Dist RCA lesion is 65% stenosed.  RPDA lesion is 50% stenosed.  Prox Cx lesion is 60% stenosed.  Prox LAD lesion is 99% stenosed with 99% stenosed side branch in 1st Sept.  1st Mrg lesion is 45% stenosed.  64 year old male with hypertension hyperlipidemia not previously treated with any medication management having significant chest discomfort shortness of breath with anterior T wave inversions and elevated troponin consistent with non-ST elevation myocardial infarction Mild LV systolic dysfunction with anteroapical hypokinesis and ejection fraction of 45% Critical proximal left anterior descending artery stenosis of 99% Moderate to severe circumflex and right coronary artery atherosclerosis not critical Patient did have some chest pain on the table relieved by nitrates Plan PCI and stent placement left anterior descending artery Dual antiplatelet therapy High intensity cholesterol therapy Beta-blocker ACE inhibitor for myocardial infarction if able Cardiac rehabilitation     Assessment and Recommendation  64 y.o. male with hypertension hyperlipidemia with acute anterior non-ST elevation myocardial infarction now status post PCI and stent placement left anterior descending artery with other mild diffuse coronary atherosclerosis 1.  Dual antiplatelet therapy for PCI and stent placement and myocardial infarction for greater than 12 months 2.  Hypertension control and treatment of cardiomyopathy and/or LV systolic dysfunction from  anterior myocardial infarction with benazepril 3.  High intensity cholesterol therapy with atorvastatin 4.  Begin ambulation and follow for improvements of symptoms and possible discharge home if patient ambulating well without any significant symptoms  Signed, Serafina Royals M.D. FACC

## 2020-04-02 NOTE — ED Notes (Signed)
No change in condition will continue to monitor  

## 2020-04-02 NOTE — Progress Notes (Signed)
Dr. Nehemiah Massed at bedside, speaking with pt. Re: cath procedure. Pt. Verbalized understanding of conversation and wishes to proceed with cath.

## 2020-04-02 NOTE — Brief Op Note (Signed)
BRIEF CARDIAC CATHETERIZATION NOTE  04/02/2020  10:15 AM  PATIENT:  Brantley Stage  63 y.o. male  PRE-OPERATIVE DIAGNOSIS:  Non-ST elevation myocardial infarction  POST-OPERATIVE DIAGNOSIS:    PROCEDURE:  Procedure(s): LEFT HEART CATH AND CORONARY ANGIOGRAPHY (N/A) CORONARY STENT INTERVENTION (N/A)  SURGEON:  Surgeon(s) and Role: Panel 1:    * Corey Skains, MD - Primary Panel 2:    * Raneshia Derick, Harrell Gave, MD - Primary  FINDINGS: 1. Multivessel CAD, as detailed in Dr. Alveria Apley dx cath note. 2. Successful PCI to proximal/mid LAD using Resolute Onyx 3.0 x 30 mm DES with 0% residual stenosis and TIMI-3 flow.  RECOMMENDATIONS: 1. DAPT with ASA and ticagrelor for at least 12 months. 2. Aggressive secondary prevention. 3. Consider staged PCI to RCA versus medical therapy and outpatient ischemia evaluation of RCA/LCx disease.  Nelva Bush, MD [facility]

## 2020-04-02 NOTE — ED Notes (Signed)
Pt without co pain, does co nausea at this time. IVF and heparin infusing well.

## 2020-04-02 NOTE — Consult Note (Signed)
[place] for Heparin Indication: chest pain/ACS  Allergies  Allergen Reactions  . Codeine Nausea Only    Patient Measurements: Height: 5\' 10"  (177.8 cm) Weight: 90.7 kg (200 lb) IBW/kg (Calculated) : 73 Heparin Dosing Weight: 90.7 kg  Vital Signs: Temp: 98.4 F (36.9 C) (05/27 1641) Temp Source: Oral (05/27 1641) BP: 151/88 (05/28 0120) Pulse Rate: 58 (05/28 0120)  Labs: Recent Labs    04/01/20 1649 04/01/20 1800 04/01/20 1836 04/01/20 2341  HGB 14.8  --   --   --   HCT 45.3  --   --   --   PLT 205  --   --   --   APTT  --  25  --   --   LABPROT  --  12.7  --   --   INR  --  1.0  --   --   HEPARINUNFRC  --   --   --  0.44  CREATININE 0.95  --   --   --   TROPONINIHS 1,926*  --  1,602*  --     Estimated Creatinine Clearance: 91.3 mL/min (by C-G formula based on SCr of 0.95 mg/dL).   Medical History: Past Medical History:  Diagnosis Date  . Arthritis    left foot s/p trauma - 30 yrs ago  . Headache    stress  . Hyperlipidemia   . Hypertension   . Insomnia   . Prostate CA ([place])     Medications:  (Not in a hospital admission)  Scheduled:  Infusions:  PRN:  Anti-infectives (From admission, onward)   None      Assessment: Pharmacy consulted for heparin for ACS. No DOAC PTA.   [id number] HL 0.44, therapeutic x 1  Goal of Therapy:  Heparin level 0.3-0.7 units/ml Monitor platelets by anticoagulation protocol: Yes   Plan:  Heparin level therapeutic, will continue heparin infusion at 1250 units/hr Recheck anti-Xa level in 6 hours to confirm and daily while on heparin Continue to monitor H&H and platelets  Ena Dawley, PharmD 04/02/2020,1:38 AM

## 2020-04-02 NOTE — Consult Note (Signed)
[facility] Consultation Note  Patient ID: Austin Wall, MRN: [id number], DOB/AGE: 62-Mar-1957 62 y.o. Admit date: 04/01/2020   Date of Consult: 04/02/2020 Primary Physician: Dion Body, MD Primary Cardiologist: Nehemiah Massed  Chief Complaint:  Chief Complaint  Patient presents with  . Chest Pain   Reason for Consult: Chest pain  HPI: 64 y.o. male with known borderline hypertension hyperlipidemia and family history of cardiovascular disease who has had waxing and waning episodes of chest discomfort over the last 4 days when seen in the office the patient continued to have somewhat chest discomfort and shortness of breath.  He felt weak.  At that time he had an EKG showing normal sinus rhythm with anterior precordial T wave inversions consistent with anterior myocardial infarction.  He was presented to the emergency room and given heparin nitrates and oxygen and has full relief of his chest pain at this time.  The patient since then has had a troponin of 1443 but no evidence of congestive heart failure.  Currently he is hemodynamically stable on appropriate medication management  Past Medical History:  Diagnosis Date  . Arthritis    left foot s/p trauma - 30 yrs ago  . Headache    stress  . Hyperlipidemia   . Hypertension   . Insomnia   . Prostate CA [facility])       Surgical History:  Past Surgical History:  Procedure Laterality Date  . CATARACT EXTRACTION Left 02/10/15   MBSC  . CATARACT EXTRACTION W/PHACO Right 04/07/2015   Procedure: CATARACT EXTRACTION PHACO AND INTRAOCULAR LENS PLACEMENT (IOC);  Surgeon: Leandrew Koyanagi, MD;  Location: [facility];  Service: Ophthalmology;  Laterality: Right;  [place]  . COLONOSCOPY    . EYE SURGERY    . MOUTH SURGERY     dental implants  . radiation treatment    . VASECTOMY       Home Meds: Prior to Admission medications   Medication Sig Start Date End Date Taking? Authorizing Provider  benazepril  (LOTENSIN) 40 MG tablet Take 1 tablet (40 mg total) by mouth daily. AM 07/10/19  Yes Crissman, Jeannette How, MD  Multiple Vitamin (MULTIVITAMIN) capsule Take 1 capsule by mouth daily. AM   Yes [provider]  PARoxetine (PAXIL) 20 MG tablet Take 20 mg by mouth at bedtime. 01/16/20  Yes [provider]  LORazepam (ATIVAN) 1 MG tablet TAKE 1 TABLET BY MOUTH DAILY AS NEEDED FOR ANXIETY Patient not taking: No sig reported 06/16/19   Guadalupe Maple, MD  meloxicam (MOBIC) 15 MG tablet TAKE 1 TABLET BY MOUTH EVERY DAY Patient not taking: Reported on 04/01/2020 03/19/19   Edrick Kins, DPM  zaleplon (SONATA) 10 MG capsule Take 1 capsule (10 mg total) by mouth at bedtime as needed. Patient not taking: Reported on 04/01/2020 07/10/19   Guadalupe Maple, MD    Inpatient Medications:  . aspirin EC  81 mg Oral Daily  . atorvastatin  40 mg Oral q1800  . benazepril  40 mg Oral Daily  . sodium chloride flush  3 mL Intravenous Q12H   . sodium chloride 10 mL/hr at 04/01/20 2016  . heparin 1,250 Units/hr (04/01/20 1829)    Allergies:  Allergies  Allergen Reactions  . Codeine Nausea Only    Social History   Socioeconomic History  . Marital status: Married    Spouse name: Not on file  . Number of children: Not on file  . Years of education: Not on file  .  Highest education level: Not on file  Occupational History  . Not on file  Tobacco Use  . Smoking status: Former Smoker    Types: Cigarettes    Quit date: 06/24/1995    Years since quitting: 24.7  . Smokeless tobacco: Never Used  Substance and Sexual Activity  . Alcohol use: Yes    Alcohol/week: 0.0 standard drinks    Comment: very little  . Drug use: No    Types: Marijuana  . Sexual activity: Not on file  Other Topics Concern  . Not on file  Social History Narrative  . Not on file   Social Determinants of Health   Financial Resource Strain:   . Difficulty of Paying Living Expenses:   Food Insecurity:   . Worried About  Charity fundraiser in the Last Year:   . Arboriculturist in the Last Year:   Transportation Needs:   . Film/video editor (Medical):   Marland Kitchen Lack of Transportation (Non-Medical):   Physical Activity:   . Days of Exercise per Week:   . Minutes of Exercise per Session:   Stress:   . Feeling of Stress :   Social Connections:   . Frequency of Communication with Friends and Family:   . Frequency of Social Gatherings with Friends and Family:   . Attends Religious Services:   . Active Member of Clubs or Organizations:   . Attends Archivist Meetings:   Marland Kitchen Marital Status:   Intimate Partner Violence:   . Fear of Current or Ex-Partner:   . Emotionally Abused:   Marland Kitchen Physically Abused:   . Sexually Abused:      Family History  Problem Relation Age of Onset  . Cancer Mother   . Hypertension Mother   . Hypertension Father      Review of Systems Positive for chest pain Negative for: General:  chills, fever, night sweats or weight changes.  Cardiovascular: PND orthopnea syncope dizziness  Dermatological skin lesions rashes Respiratory: Cough congestion Urologic: Frequent urination urination at night and hematuria Abdominal: negative for nausea, vomiting, diarrhea, bright red blood per rectum, melena, or hematemesis Neurologic: negative for visual changes, and/or hearing changes  All other systems reviewed and are otherwise negative except as noted above.  Labs: No results for input(s): CKTOTAL, CKMB, TROPONINI in the last 72 hours. Lab Results  Component Value Date   WBC 8.7 04/02/2020   HGB 15.0 04/02/2020   HCT 44.1 04/02/2020   MCV 89.3 04/02/2020   PLT 195 04/02/2020    Recent Labs  Lab 04/02/20 0504  NA 140  K 3.8  CL 105  CO2 28  BUN 19  CREATININE 0.99  CALCIUM 9.7  GLUCOSE 122*   Lab Results  Component Value Date   CHOL 270 (H) 04/02/2020   HDL 50 04/02/2020   LDLCALC 183 (H) 04/02/2020   TRIG 187 (H) 04/02/2020   No results found for:  DDIMER  Radiology/Studies:  DG Chest 2 View  Result Date: 04/01/2020 CLINICAL DATA:  Chest pressure and pain that began yesterday, hypertension EXAM: CHEST - 2 VIEW COMPARISON:  None. FINDINGS: The heart size and mediastinal contours are within normal limits. Both lungs are clear. The visualized skeletal structures are unremarkable. IMPRESSION: No active cardiopulmonary disease. Electronically Signed   By: Randa Ngo M.D.   On: 04/01/2020 17:18    EKG: Normal sinus rhythm with anterior precordial T wave inversion consistent with anterior myocardial infarction  Weights: [employer]  04/01/20 1634  Weight: 90.7 kg     Physical Exam: Blood pressure (!) 146/85, pulse 85, temperature 98.4 F (36.9 C), temperature source Oral, resp. rate (!) 31, height 5\' 10"  (1.778 m), weight 90.7 kg, SpO2 97 %. Body mass index is 28.7 kg/m. General: Well developed, well nourished, in no acute distress. Head eyes ears nose throat: Normocephalic, atraumatic, sclera non-icteric, no xanthomas, nares are without discharge. No apparent thyromegaly and/or mass  Lungs: Normal respiratory effort.  no wheezes, no rales, no rhonchi.  Heart: RRR with normal S1 S2. no murmur gallop, no rub, PMI is normal size and placement, carotid upstroke normal without bruit, jugular venous pressure is normal Abdomen: Soft, non-tender, non-distended with normoactive bowel sounds. No hepatomegaly. No rebound/guarding. No obvious abdominal masses. Abdominal aorta is normal size without bruit Extremities: No edema. no cyanosis, no clubbing, no ulcers  Peripheral : 2+ bilateral upper extremity pulses, 2+ bilateral femoral pulses, 2+ bilateral dorsal pedal pulse Neuro: Alert and oriented. No facial asymmetry. No focal deficit. Moves all extremities spontaneously. Musculoskeletal: Normal muscle tone without kyphosis Psych:  Responds to questions appropriately with a normal affect.    Assessment: 64 year old male with acute  anterior non-ST elevation myocardial infarction without evidence of congestive heart failure  Plan: 1.  Heparin for further risk reduction in myocardial infarction and treatment of myocardial infarction 2.  Aspirin nitrates beta-blocker for myocardial infarction 3.  Proceed to cardiac catheterization to assess coronary anatomy and further treatment thereof is necessary.  Patient understands the risk and benefits of cardiac catheterization.  This includes the possibility of death stroke heart attack infection bleeding or blood clot.  He is at low risk for conscious sedation  Signed, Corey Skains M.D. [facility] 04/02/2020, 7:32 AM

## 2020-04-02 NOTE — ED Notes (Signed)
Report received from [facility]. Patient care assumed. Patient/RN introduction complete. Will continue to monitor.

## 2020-04-02 NOTE — Progress Notes (Signed)
   04/02/20 1600  Clinical Encounter Type  Visited With Patient and family together  Visit Type Initial  Referral From Nurse  Consult/Referral To Chaplain  Chaplain visited with patient and wife. Patient is appointing his wife as his agent. Chaplain explained two witnesses and a notary are needed in order for AD to take affect. Chaplain left AD with patient and wife and tole them to have chaplain paged when patient is ready to sign.

## 2020-04-02 NOTE — Consult Note (Signed)
Austin Wall for Heparin Indication: chest pain/ACS  Allergies  Allergen Reactions  . Codeine Nausea Only   Patient Measurements: Height: 5\' 10"  (177.8 cm) Weight: 90.7 kg (200 lb) IBW/kg (Calculated) : 73 Heparin Dosing Weight: 90.7 kg  Vital Signs: BP: 146/85 (05/28 0631) Pulse Rate: 62 (05/28 0631)  Labs: Recent Labs    04/01/20 1649 04/01/20 1800 04/01/20 1836 04/01/20 2341 04/02/20 0504 04/02/20 0544  HGB 14.8  --   --   --  15.0  --   HCT 45.3  --   --   --  44.1  --   PLT 205  --   --   --  195  --   APTT  --  25  --   --   --   --   LABPROT  --  12.7  --   --   --   --   INR  --  1.0  --   --   --   --   HEPARINUNFRC  --   --   --  0.44  --  0.47  CREATININE 0.95  --   --   --  0.99  --   TROPONINIHS 1,926*  --  1,602*  --  1,443*  --     Estimated Creatinine Clearance: 87.7 mL/min (by C-G formula based on SCr of 0.99 mg/dL).   Medical History: Past Medical History:  Diagnosis Date  . Arthritis    left foot s/p trauma - 30 yrs ago  . Headache    stress  . Hyperlipidemia   . Hypertension   . Insomnia   . Prostate CA ([place])     Medications:  (Not in a hospital admission)  Scheduled:  Infusions:  PRN:  Anti-infectives (From admission, onward)   None      Assessment: Pharmacy consulted for heparin for ACS. No DOAC PTA.   0527 2341 HL 0.44, therapeutic x 1 0528 0544 HL 0.47, therapeutic x 2, CBC stable  Goal of Therapy:  Heparin level 0.3-0.7 units/ml Monitor platelets by anticoagulation protocol: Yes   Plan:  Heparin level therapeutic, will continue heparin infusion at 1250 units/hr Recheck anti-Xa level in daily in am w/ CBC Continue to monitor H&H and platelets  Ena Dawley, PharmD 04/02/2020,6:35 AM

## 2020-04-02 NOTE — Progress Notes (Addendum)
PROGRESS NOTE    Austin Wall  [id number] DOB: [birth year] DOA: 04/01/2020 PCP: Dion Body, MD     Brief Narrative:  Austin Wall is a 64 y.o. WM PMHx HTN, HLD, Prostate cancer, ,   Seen in ed today for chest pain.pt was at pcp office for his annual physical and told him reported chest discomfort , 3 weeks  EKG was abnormal and was sent to cardiology.  Chest pain is first time episode and when it started 3 weeks ago walking up hill and located midsternal and first time it was 8 /10, it lasted about 20 minutes and rested with 2 aspirin and Rested and chest pain resolved. Then since then he notices the pattern has been intermittent and unrelated to rest or exercise,and some times has woke him up from sleep. +sob no nausea or vomiting/ no diaphoresis . Since 3 weeks chest pain has been random and unpredictable. Pt did have chest pain at his appt today and was given sl ntg the chest pain was resolved with ntg. NR pain is localized, pain is pressure tightness.left arm numbness has been happening since past few  Days.  ED Course:  Blood pressure 139/74, pulse 60, temperature 98.4 F (36.9 C), temperature source Oral, resp. rate 20, height 5\' 10"  (1.778 m), weight 90.7 kg, SpO2 96 %. Pt was found to have elevated troponin of 1926 and has been started on heparin drip with initial bolus. Covid-19 is done and pending however pt has no symptoms of pulmonary issues. He has had his covid-19 shots.   Review of Systems: As per HPI otherwise 10 point review of systems negative.    Subjective: In Cath Lab   Assessment & Plan:   Principal Problem:   NSTEMI (non-ST elevated myocardial infarction) [facility]) Active Problems:   Hypertension   Unstable angina ([place])  UA/ NSTEMI: -Admit to cardiac unit with continuous telemetry monitoring.  -Cont asa/ add Lipitor 40 mg am lipid panel / cont benazepril  prn ntg/ oxygen and morphine and  Therapeutic diose heparin for anticoagulation.    -Cardiac diet and npo after midnight. -Cardiology consult with plan for cath in am.   HTN: -cont amlodipine and ACEI, BB therapy not initiated due to heart rate of 60's.    DVT prophylaxis:Therapeutic dose anticoagulation for NSTEMI Code Status:  Family Communication: Vaughan Basta ( wife ) [phone]. Disposition Plan:  Status is: Inpatient    Dispo: The patient is from:               Anticipated d/c is to:               Anticipated d/c date is:               Patient currently unstable      Consultants:  Cardiology  Procedures/Significant Events:  5/28 cardiac catheterization  I have personally reviewed and interpreted all radiology studies and my findings are as above.  VENTILATOR SETTINGS:    Cultures   Antimicrobials:    Devices    LINES / TUBES:      Continuous Infusions: . sodium chloride 10 mL/hr at 04/01/20 2016  . sodium chloride    . [START ON 04/03/2020] sodium chloride     Followed by  . [START ON 04/03/2020] sodium chloride    . heparin 1,250 Units/hr (04/01/20 1829)     Objective: Vitals:   04/02/20 0121 04/02/20 0501 04/02/20 0631 04/02/20 0728  BP:  (!) 152/105 (!) 146/85   Pulse:  64 62 85  Resp: 20 18 18  (!) 31  Temp:      TempSrc:      SpO2:  97% 95% 97%  Weight:      Height:        Intake/Output Summary (Last 24 hours) at 04/02/2020 0749 Last data filed at 04/02/2020 0745 Gross per 24 hour  Intake --  Output 250 ml  Net -250 ml   Filed Weights   04/01/20 1634  Weight: 90.7 kg    Attempted to see patient on 2 occasions both times in cardiac catheterization lab .     Data Reviewed: Care during the described time interval was provided by me .  I have reviewed this patient's available data, including medical history, events of note, physical examination, and all test results as part of my evaluation.  CBC: Recent Labs  Lab 04/01/20 1649 04/02/20 0504  WBC 7.9 8.7  HGB 14.8 15.0  HCT 45.3 44.1  MCV 91.7 89.3   PLT 205 0000000   Basic Metabolic Panel: Recent Labs  Lab 04/01/20 1649 04/02/20 0504  NA 140 140  K 3.8 3.8  CL 104 105  CO2 28 28  GLUCOSE 111* 122*  BUN 20 19  CREATININE 0.95 0.99  CALCIUM 9.7 9.7   GFR: Estimated Creatinine Clearance: 87.7 mL/min (by C-G formula based on SCr of 0.99 mg/dL). Liver Function Tests: No results for input(s): AST, ALT, ALKPHOS, BILITOT, PROT, ALBUMIN in the last 168 hours. No results for input(s): LIPASE, AMYLASE in the last 168 hours. No results for input(s): AMMONIA in the last 168 hours. Coagulation Profile: Recent Labs  Lab 04/01/20 1800  INR 1.0   Cardiac Enzymes: No results for input(s): CKTOTAL, CKMB, CKMBINDEX, TROPONINI in the last 168 hours. BNP (last 3 results) No results for input(s): PROBNP in the last 8760 hours. HbA1C: Recent Labs    04/01/20 1836  HGBA1C 6.0*   CBG: No results for input(s): GLUCAP in the last 168 hours. Lipid Profile: Recent Labs    04/02/20 0504  CHOL 270*  HDL 50  LDLCALC 183*  TRIG 187*  CHOLHDL 5.4   Thyroid Function Tests: Recent Labs    04/01/20 1924  TSH 1.433  FREET4 0.76   Anemia Panel: No results for input(s): VITAMINB12, FOLATE, FERRITIN, TIBC, IRON, RETICCTPCT in the last 72 hours. Sepsis Labs: No results for input(s): PROCALCITON, LATICACIDVEN in the last 168 hours.  Recent Results (from the past 240 hour(s))  SARS Coronavirus 2 by RT PCR (hospital order, performed in [facility] hospital lab) Nasopharyngeal Nasopharyngeal Swab     Status: None   Collection Time: 04/01/20  5:30 PM   Specimen: Nasopharyngeal Swab  Result Value Ref Range Status   SARS Coronavirus 2 NEGATIVE NEGATIVE Final    Comment: (NOTE) SARS-CoV-2 target nucleic acids are NOT DETECTED. The SARS-CoV-2 RNA is generally detectable in upper and lower respiratory specimens during the acute phase of infection. The lowest concentration of SARS-CoV-2 viral copies this assay can detect is 250 copies / mL. A  negative result does not preclude SARS-CoV-2 infection and should not be used as the sole basis for treatment or other patient management decisions.  A negative result may occur with improper specimen collection / handling, submission of specimen other than nasopharyngeal swab, presence of viral mutation(s) within the areas targeted by this assay, and inadequate number of viral copies (<250 copies / mL). A negative result must be combined with clinical observations, patient history, and epidemiological information. Fact  Sheet for Patients:   StrictlyIdeas.no Fact Sheet for Healthcare Providers: BankingDealers.co.za This test is not yet approved or cleared  by the [place] FDA and has been authorized for detection and/or diagnosis of SARS-CoV-2 by FDA under an Emergency Use Authorization (EUA).  This EUA will remain in effect (meaning this test can be used) for the duration of the COVID-19 declaration under Section 564(b)(1) of the Act, 21 U.S.C. section 360bbb-3(b)(1), unless the authorization is terminated or revoked sooner. Performed at [facility], [address], [place], [place] [address]          Radiology Studies: DG Chest 2 View  Result Date: 04/01/2020 CLINICAL DATA:  Chest pressure and pain that began yesterday, hypertension EXAM: CHEST - 2 VIEW COMPARISON:  None. FINDINGS: The heart size and mediastinal contours are within normal limits. Both lungs are clear. The visualized skeletal structures are unremarkable. IMPRESSION: No active cardiopulmonary disease. Electronically Signed   By: Randa Ngo M.D.   On: 04/01/2020 17:18        Scheduled Meds: . [START ON 04/03/2020] aspirin  81 mg Oral Pre-Cath  . aspirin EC  81 mg Oral Daily  . atorvastatin  40 mg Oral q1800  . benazepril  40 mg Oral Daily  . sodium chloride flush  3 mL Intravenous Q12H   Continuous Infusions: . sodium chloride 10 mL/hr at  04/01/20 2016  . sodium chloride    . [START ON 04/03/2020] sodium chloride     Followed by  . [START ON 04/03/2020] sodium chloride    . heparin 1,250 Units/hr (04/01/20 1829)     LOS: 1 day    Time spent:40 min    Stephaie Dardis, Geraldo Docker, MD [facility] Pager [phone]  If 7PM-7AM, please contact night-coverage [email or web address] Password [facility] 04/02/2020, 7:49 AM

## 2020-04-02 NOTE — ED Notes (Signed)
Discussed with provider about adding troponin at 5am. Provider stated that it was okay to add 5am troponin on pt

## 2020-04-03 DIAGNOSIS — R778 Other specified abnormalities of plasma proteins: Secondary | ICD-10-CM

## 2020-04-03 DIAGNOSIS — I10 Essential (primary) hypertension: Secondary | ICD-10-CM

## 2020-04-03 DIAGNOSIS — I200 Unstable angina: Secondary | ICD-10-CM

## 2020-04-03 DIAGNOSIS — R079 Chest pain, unspecified: Secondary | ICD-10-CM

## 2020-04-03 LAB — COMPREHENSIVE METABOLIC PANEL
ALT: 39 U/L (ref 0–44)
AST: 39 U/L (ref 15–41)
Albumin: 4.2 g/dL (ref 3.5–5.0)
Alkaline Phosphatase: 54 U/L (ref 38–126)
Anion gap: 9 (ref 5–15)
BUN: 15 mg/dL (ref 8–23)
CO2: 25 mmol/L (ref 22–32)
Calcium: 9.5 mg/dL (ref 8.9–10.3)
Chloride: 103 mmol/L (ref 98–111)
Creatinine, Ser: 0.83 mg/dL (ref 0.61–1.24)
GFR calc Af Amer: >60 mL/min (ref >60)
GFR calc non Af Amer: >60 mL/min (ref >60)
Glucose, Bld: 121 mg/dL — ABNORMAL HIGH (ref 70–99)
Potassium: 3.9 mmol/L (ref 3.5–5.1)
Sodium: 137 mmol/L (ref 135–145)
Total Bilirubin: 1.2 mg/dL (ref 0.3–1.2)
Total Protein: 7.4 g/dL (ref 6.5–8.1)

## 2020-04-03 LAB — CBC
HCT: 46.8 % (ref 39.0–52.0)
Hemoglobin: 15.9 g/dL (ref 13.0–17.0)
MCH: 30.1 pg (ref 26.0–34.0)
MCHC: 34 g/dL (ref 30.0–36.0)
MCV: 88.5 fL (ref 80.0–100.0)
Platelets: 187 10*3/uL (ref 150–400)
RBC: 5.29 MIL/uL (ref 4.22–5.81)
RDW: 12.3 % (ref 11.5–15.5)
WBC: 9.7 10*3/uL (ref 4.0–10.5)
nRBC: 0 % (ref 0.0–0.2)

## 2020-04-03 LAB — MAGNESIUM: Magnesium: 2.2 mg/dL (ref 1.7–2.4)

## 2020-04-03 LAB — HIV ANTIBODY (ROUTINE TESTING W REFLEX): HIV Screen 4th Generation wRfx: NONREACTIVE

## 2020-04-03 LAB — PHOSPHORUS: Phosphorus: 2.7 mg/dL (ref 2.5–4.6)

## 2020-04-03 MED ORDER — ASPIRIN 81 MG PO TBEC: 81.0000 mg | DELAYED_RELEASE_TABLET | Freq: Every day | ORAL | 0 refills | Status: AC

## 2020-04-03 MED ORDER — ATORVASTATIN CALCIUM 40 MG PO TABS: 40.0000 mg | ORAL_TABLET | Freq: Every day | ORAL | 0 refills | Status: DC

## 2020-04-03 MED ORDER — NITROGLYCERIN 0.4 MG SL SUBL: 0.4000 mg | SUBLINGUAL_TABLET | SUBLINGUAL | 0 refills | Status: DC | PRN

## 2020-04-03 MED ORDER — BENAZEPRIL HCL 40 MG PO TABS: 40.0000 mg | ORAL_TABLET | Freq: Every day | ORAL | 0 refills | Status: AC

## 2020-04-03 MED ORDER — TICAGRELOR 90 MG PO TABS: 90.0000 mg | ORAL_TABLET | Freq: Two times a day (BID) | ORAL | 0 refills | Status: DC

## 2020-04-03 NOTE — Evaluation (Signed)
Physical Therapy Evaluation Patient Details Name: Austin Wall MRN: [id number] DOB: 1956-04-04 Today's Date: 04/03/2020   History of Present Illness  64 yo male with onset of chest pain intermittently over last 3 weeks was admitted, had heart cath with stenting on 5/28, now referred to rehab.  NSTEMI diagnosis, clear chest xray, pain resolved.  PMHx:  CAD, elevated BP, SOB, angina,   Clinical Impression  Pt was assessed with mobility for HR, sats and monitored telemetry unit signal.  Pt had a max HR of 116, sat down to 92% with walk up to 600'.  Talked with him about cardiac rehab, and the program will be available to him.  WIll benefit from endurance and strength training, as well as education from the program.  Follow acutely as needed until dc to this program.    Follow Up Recommendations Other (comment)(cardiac rehab)    Equipment Recommendations  None recommended by PT    Recommendations for Other Services       Precautions / Restrictions Precautions Precautions: Other (comment)(monitor heart function) Precaution Comments: ck HR and sats Restrictions Weight Bearing Restrictions: No      Mobility  Bed Mobility Overal bed mobility: Independent                Transfers Overall transfer level: Independent Equipment used: None                Ambulation/Gait Ambulation/Gait assistance: Supervision Gait Distance (Feet): 600 Feet Assistive device: None Gait Pattern/deviations: Step-through pattern;Narrow base of support Gait velocity: normal Gait velocity interpretation: >2.62 ft/sec, indicative of community ambulatory General Gait Details: monitored vitals with gait and note his HR up to 116 and sat down to 92% with effort  Stairs            Wheelchair Mobility    Modified Rankin (Stroke Patients Only)       Balance Overall balance assessment: Modified Independent                                           Pertinent  Vitals/Pain Pain Assessment: No/denies pain    Home Living Family/patient expects to be discharged to:: Private residence Living Arrangements: Spouse/significant other Available Help at Discharge: Family;Available 24 hours/day Type of Home: House Home Access: Stairs to enter   [employer] of Steps: 2 Home Layout: One level Home Equipment: None      Prior Function Level of Independence: Independent         Comments: works as Restaurant manager, 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     [place]: Right    Extremity/Trunk Assessment   Upper Extremity Assessment Upper Extremity Assessment: Overall WFL for tasks assessed    Lower Extremity Assessment Lower Extremity Assessment: Overall WFL for tasks assessed(strength is 4- on hams and hip abd)    Cervical / Trunk Assessment Cervical / Trunk Assessment: Normal  Communication      Cognition Arousal/Alertness: Awake/alert Behavior During Therapy: WFL for tasks assessed/performed Overall Cognitive Status: Within Functional Limits for tasks assessed                                        General Comments General comments (skin integrity, edema, etc.): pt is showing cardiac fatigue  with walking effort, recommended to him to go to cardiac rehab due to strength changes in legs and HR. sat changes with gait    Exercises     Assessment/Plan    PT Assessment Patient needs continued PT services  PT Problem List Decreased strength;Decreased knowledge of use of DME;Cardiopulmonary status limiting activity       PT Treatment Interventions DME instruction;Gait training;Stair training;Functional mobility training;Therapeutic activities;Therapeutic exercise;Balance training;Neuromuscular re-education;Patient/family education    PT Goals (Current goals can be found in the Care Plan section)  Acute Rehab PT Goals Patient Stated Goal: get home and back to work PT Goal Formulation:  With patient Time For Goal Achievement: 04/10/20 Potential to Achieve Goals: Good    Frequency Min 2X/week   Barriers to discharge   home in accessible spot with family    Co-evaluation               AM-PAC PT "6 Clicks" Mobility  Outcome Measure Help needed turning from your back to your side while in a flat bed without using bedrails?: None Help needed moving from lying on your back to sitting on the side of a flat bed without using bedrails?: None Help needed moving to and from a bed to a chair (including a wheelchair)?: None Help needed standing up from a chair using your arms (e.g., wheelchair or bedside chair)?: None Help needed to walk in hospital room?: A Little Help needed climbing 3-5 steps with a railing? : A Little 6 Click Score: 22    End of Session   Activity Tolerance: Treatment limited secondary to medical complications (Comment) Patient left: in chair;with call bell/phone within reach;with nursing/sitter in room Nurse Communication: Mobility status PT Visit Diagnosis: Muscle weakness (generalized) (M62.81)    Time: [id number] PT Time Calculation (min) (ACUTE ONLY): 27 min   Charges:   PT Evaluation $PT Eval Moderate Complexity: 1 Mod PT Treatments $Gait Training: 8-22 mins       Ramond Dial 04/03/2020, 1:21 PM  Mee Hives, PT MS Acute Rehab Dept. Number: [place] [facility] [place]

## 2020-04-03 NOTE — Progress Notes (Signed)
[facility] Encounter Note  Patient: Austin Wall / Admit Date: 04/01/2020 / Date of Encounter: 04/03/2020, 5:33 AM   Subjective: No further chest pain shortness of breath or other significant cardiovascular symptoms.  Patient has had elevation of troponin most consistent with non-ST elevation myocardial infarction with anterior T wave inversions.  The patient underwent PCI and stent placement of left anterior descending artery due to a 99% stenosis in the proximal portion of the left anterior descending artery.  No significant complications Patient does have some mild hypertension overnight possibly due to need for benazepril reinstatement  Review of Systems: Positive for: None Negative for: Vision change, hearing change, syncope, dizziness, nausea, vomiting,diarrhea, bloody stool, stomach pain, cough, congestion, diaphoresis, urinary frequency, urinary pain,skin lesions, skin rashes Others previously listed  Objective: Telemetry: Normal sinus rhythm Physical Exam: Blood pressure (!) 176/85, pulse 64, temperature 97.9 F (36.6 C), temperature source Oral, resp. rate 16, height 5\' 10"  (1.778 m), weight 88.5 kg, SpO2 95 %. Body mass index is 27.98 kg/m. General: Well developed, well nourished, in no acute distress. Head: Normocephalic, atraumatic, sclera non-icteric, no xanthomas, nares are without discharge. Neck: No apparent masses Lungs: Normal respirations with no wheezes, no rhonchi, no rales , no crackles   Heart: Regular rate and rhythm, normal S1 S2, no murmur, no rub, no gallop, PMI is normal size and placement, carotid upstroke normal without bruit, jugular venous pressure normal Abdomen: Soft, non-tender, non-distended with normoactive bowel sounds. No hepatosplenomegaly. Abdominal aorta is normal size without bruit Extremities: No edema, no clubbing, no cyanosis, no ulcers,  Peripheral: 2+ radial, 2+ femoral, 2+ dorsal pedal pulses Neuro: Alert and  oriented. Moves all extremities spontaneously. Psych:  Responds to questions appropriately with a normal affect.   Intake/Output Summary (Last 24 hours) at 04/03/2020 0533 Last data filed at 04/03/2020 0410 Gross per 24 hour  Intake 419.71 ml  Output 2125 ml  Net -1705.29 ml    Inpatient Medications:  . aspirin EC  81 mg Oral Daily  . atorvastatin  40 mg Oral q1800  . benazepril  40 mg Oral Daily  . enoxaparin (LOVENOX) injection  40 mg Subcutaneous Q24H  . PARoxetine  20 mg Oral QHS  . sodium chloride flush  3 mL Intravenous Q12H  . sodium chloride flush  3 mL Intravenous Q12H  . ticagrelor  90 mg Oral BID   Infusions:  . sodium chloride 10 mL/hr at 04/02/20 1642  . sodium chloride      Labs: Recent Labs    04/01/20 1649 04/02/20 0504  NA 140 140  K 3.8 3.8  CL 104 105  CO2 28 28  GLUCOSE 111* 122*  BUN 20 19  CREATININE 0.95 0.99  CALCIUM 9.7 9.7   No results for input(s): AST, ALT, ALKPHOS, BILITOT, PROT, ALBUMIN in the last 72 hours. Recent Labs    04/01/20 1649 04/02/20 0504  WBC 7.9 8.7  HGB 14.8 15.0  HCT 45.3 44.1  MCV 91.7 89.3  PLT 205 195   No results for input(s): CKTOTAL, CKMB, TROPONINI in the last 72 hours. Invalid input(s): POCBNP Recent Labs    04/01/20 1836  HGBA1C 6.0*     Weights: Filed Weights   04/01/20 1634 04/02/20 0753 04/03/20 0403  Weight: 90.7 kg 90.7 kg 88.5 kg     Radiology/Studies:  DG Chest 2 View  Result Date: 04/01/2020 CLINICAL DATA:  Chest pressure and pain that began yesterday, hypertension EXAM: CHEST - 2 VIEW COMPARISON:  None. FINDINGS: The heart size and mediastinal contours are within normal limits. Both lungs are clear. The visualized skeletal structures are unremarkable. IMPRESSION: No active cardiopulmonary disease. Electronically Signed   By: Randa Ngo M.D.   On: 04/01/2020 17:18   CARDIAC CATHETERIZATION  Result Date: 04/02/2020 Conclusions: 1. Multivessel CAD, as detailed in Dr. Alveria Apley  diagnostic catheterization note. 2. Successful PCI to 99% proximal/mid LAD stenosis using Resolute Onyx 3.0 x 30 mm drug-eluting stent with 0% residual stenosis and TIMI-3 flow.  Recommendations: 1. Dual antiplatelet therapy with aspirin and ticagrelor for at least 12 months. 2. Aggressive secondary prevention. 3. Consider staged PCI to RCA versus medical therapy and outpatient ischemia evaluation of RCA/LCx disease. Nelva Bush, MD [facility]   CARDIAC CATHETERIZATION  Result Date: 04/02/2020  Prox RCA lesion is 65% stenosed.  Prox RCA to Mid RCA lesion is 50% stenosed.  Mid RCA lesion is 70% stenosed.  Dist RCA lesion is 65% stenosed.  RPDA lesion is 50% stenosed.  Prox Cx lesion is 60% stenosed.  Prox LAD lesion is 99% stenosed with 99% stenosed side branch in 1st Sept.  1st Mrg lesion is 45% stenosed.  64 year old male with hypertension hyperlipidemia not previously treated with any medication management having significant chest discomfort shortness of breath with anterior T wave inversions and elevated troponin consistent with non-ST elevation myocardial infarction Mild LV systolic dysfunction with anteroapical hypokinesis and ejection fraction of 45% Critical proximal left anterior descending artery stenosis of 99% Moderate to severe circumflex and right coronary artery atherosclerosis not critical Patient did have some chest pain on the table relieved by nitrates Plan PCI and stent placement left anterior descending artery Dual antiplatelet therapy High intensity cholesterol therapy Beta-blocker ACE inhibitor for myocardial infarction if able Cardiac rehabilitation     Assessment and Recommendation  64 y.o. male with hypertension hyperlipidemia with acute anterior non-ST elevation myocardial infarction now status post PCI and stent placement left anterior descending artery with other mild diffuse coronary atherosclerosis 1.  Dual antiplatelet therapy for PCI and stent placement and  myocardial infarction for greater than 12 months 2.  Hypertension control and treatment of cardiomyopathy and/or LV systolic dysfunction from anterior myocardial infarction with benazepril.  Will not add beta-blocker at this time due to bradycardia 3.  High intensity cholesterol therapy with atorvastatin 4.  Begin ambulation and follow for improvements of symptoms and possible discharge home if patient ambulating well without any significant symptoms with follow-up in 1 to 2 weeks  Signed, Serafina Royals M.D. FACC

## 2020-04-03 NOTE — Discharge Summary (Signed)
Physician Discharge Summary  Austin Wall [id number] DOB: Mar 17, 1956 DOA: 04/01/2020  PCP: Dion Body, MD  Admit date: 04/01/2020 Discharge date: 04/03/2020  Time spent: 30 minutes  Recommendations for Outpatient Follow-up:   NSTEMI: -Admit to cardiac unit with continuous telemetry monitoring.  -5/28 s/p PCI with placement of stent in LAD. -Dual antiplatelet therapy for PCI and stent placement for> 12 months -ASA 81 mg daily -Brilinta 90 mg BID -Get you follow-up with Dr. Serafina Royals cardiology in 1 to 2 weeks  Essential HTN: --Benazepril 40 mg daily -Hold beta-blocker at this time secondary to bradycardia -Patient's BP slightly above the optimal range of 120/80 however cardiology deems patient safe for discharge and patient will follow-up in 1 to 2 weeks with cardiology.  HLD -Lipitor 40 mg daily.  Would expect that at follow-up increase to 80 mg daily    Discharge Diagnoses:  Principal Problem:   NSTEMI (non-ST elevated myocardial infarction) [facility]) Active Problems:   Hypertension   Unstable angina [facility])   Discharge Condition: Stable  Diet recommendation: Heart healthy  Filed Weights   04/01/20 1634 04/02/20 0753 04/03/20 0403  Weight: 90.7 kg 90.7 kg 88.5 kg    History of present illness:  64 y.o.WM PMHx HTN, HLD, Prostate cancer, ,   Seen in ed today for chest pain.pt was at pcp office for his annual physical and told him reported chest discomfort , 3 weeks EKG was abnormal and was sent to cardiology.  Chest pain is first time episode and when it started 3 weeks ago walking up hill and located midsternal and first time it was 8 /10, it lasted about 20 minutes and rested with 2 aspirin and Rested and chest pain resolved. Then since then he notices the pattern has been intermittent and unrelated to rest or exercise,and some times has woke him up from sleep. +sob no nausea or vomiting/ no diaphoresis . Since 3 weeks chest pain has been random and  unpredictable. Pt did have chest pain at his appt today and was given sl ntg the chest pain was resolved with ntg. NR pain is localized, pain is pressure tightness.left arm numbness has been happening since past few Days.  ED Course: Blood pressure 139/74, pulse 60, temperature 98.4 F (36.9 C), temperature source Oral, resp. rate 20, height 5\' 10"  (1.778 m), weight 90.7 kg, SpO2 96 %. Pt was found to have elevated troponin of1926and has been started on heparin drip with initial bolus. Covid-19 is done and pending however pt has no symptoms of pulmonary issues. He has had his covid-19 shots  Hospital Course:  See above   Procedures: 5/28 cardiac catheterization;Multivessel CAD, as detailed in Dr. Alveria Apley dx cath note. -Successful PCI to proximal/mid LAD using Resolute Onyx 3.0 x 30 mm DES with 0% residual stenosis and TIMI-3 flow   Consultations: Cardiology   Antibiotics Anti-infectives (From admission, onward)   None       Discharge Exam: Vitals:   04/03/20 0403 04/03/20 0611 04/03/20 0719 04/03/20 1141  BP: (!) 176/85 (!) 141/86 (!) 165/91 (!) 146/75  Pulse: 64 68 65 64  Resp: 16  17 17   Temp: 97.9 F (36.6 C)  97.8 F (36.6 C) 97.8 F (36.6 C)  TempSrc: Oral  Oral   SpO2: 95%  97% 98%  Weight: 88.5 kg     Height:        Physical Exam: General: No acute respiratory distress Eyes: negative scleral hemorrhage, negative anisocoria, negative icterus ENT: Negative Runny nose, negative  gingival bleeding, Neck:  Negative scars, masses, torticollis, lymphadenopathy, JVD Lungs: Clear to auscultation bilaterally without wheezes or crackles Cardiovascular: Regular rate and rhythm without murmur gallop or rub normal S1 and S2     Discharge Instructions  Discharge Instructions    AMB Referral to Cardiac Rehabilitation - Phase II   Complete by: As directed    Diagnosis:  Coronary Stents NSTEMI     After initial evaluation and assessments completed: Virtual  Based Care may be provided alone or in conjunction with Phase 2 Cardiac Rehab based on patient barriers.: Yes     Allergies as of 04/03/2020      Reactions   Codeine Nausea Only      Medication List    STOP taking these medications   LORazepam 1 MG tablet Commonly known as: ATIVAN   meloxicam 15 MG tablet Commonly known as: MOBIC   zaleplon 10 MG capsule Commonly known as: SONATA     TAKE these medications   aspirin 81 MG EC tablet Take 1 tablet (81 mg total) by mouth daily. Start taking on: Apr 04, 2020   atorvastatin 40 MG tablet Commonly known as: LIPITOR Take 1 tablet (40 mg total) by mouth daily at 6 PM.   benazepril 40 MG tablet Commonly known as: LOTENSIN Take 1 tablet (40 mg total) by mouth daily. Start taking on: Apr 04, 2020 What changed: additional instructions   multivitamin capsule Take 1 capsule by mouth daily. AM   nitroGLYCERIN 0.4 MG SL tablet Commonly known as: NITROSTAT Place 1 tablet (0.4 mg total) under the tongue every 5 (five) minutes x 3 doses as needed for chest pain.   PARoxetine 20 MG tablet Commonly known as: PAXIL Take 20 mg by mouth at bedtime.   ticagrelor 90 MG Tabs tablet Commonly known as: BRILINTA Take 1 tablet (90 mg total) by mouth 2 (two) times daily.      Allergies  Allergen Reactions  . Codeine Nausea Only      The results of significant diagnostics from this hospitalization (including imaging, microbiology, ancillary and laboratory) are listed below for reference.    Significant Diagnostic Studies: DG Chest 2 View  Result Date: 04/01/2020 CLINICAL DATA:  Chest pressure and pain that began yesterday, hypertension EXAM: CHEST - 2 VIEW COMPARISON:  None. FINDINGS: The heart size and mediastinal contours are within normal limits. Both lungs are clear. The visualized skeletal structures are unremarkable. IMPRESSION: No active cardiopulmonary disease. Electronically Signed   By: Randa Ngo M.D.   On: 04/01/2020  17:18   CARDIAC CATHETERIZATION  Result Date: 04/02/2020 Conclusions: 1. Multivessel CAD, as detailed in Dr. Alveria Apley diagnostic catheterization note. 2. Successful PCI to 99% proximal/mid LAD stenosis using Resolute Onyx 3.0 x 30 mm drug-eluting stent with 0% residual stenosis and TIMI-3 flow.  Recommendations: 1. Dual antiplatelet therapy with aspirin and ticagrelor for at least 12 months. 2. Aggressive secondary prevention. 3. Consider staged PCI to RCA versus medical therapy and outpatient ischemia evaluation of RCA/LCx disease. Nelva Bush, MD [facility]   CARDIAC CATHETERIZATION  Result Date: 04/02/2020  Prox RCA lesion is 65% stenosed.  Prox RCA to Mid RCA lesion is 50% stenosed.  Mid RCA lesion is 70% stenosed.  Dist RCA lesion is 65% stenosed.  RPDA lesion is 50% stenosed.  Prox Cx lesion is 60% stenosed.  Prox LAD lesion is 99% stenosed with 99% stenosed side branch in 1st Sept.  1st Mrg lesion is 45% stenosed.  64 year old male with hypertension hyperlipidemia  not previously treated with any medication management having significant chest discomfort shortness of breath with anterior T wave inversions and elevated troponin consistent with non-ST elevation myocardial infarction Mild LV systolic dysfunction with anteroapical hypokinesis and ejection fraction of 45% Critical proximal left anterior descending artery stenosis of 99% Moderate to severe circumflex and right coronary artery atherosclerosis not critical Patient did have some chest pain on the table relieved by nitrates Plan PCI and stent placement left anterior descending artery Dual antiplatelet therapy High intensity cholesterol therapy Beta-blocker ACE inhibitor for myocardial infarction if able Cardiac rehabilitation    Microbiology: Recent Results (from the past 240 hour(s))  SARS Coronavirus 2 by RT PCR (hospital order, performed in [place] hospital lab) Nasopharyngeal Nasopharyngeal Swab     Status: None    Collection Time: 04/01/20  5:30 PM   Specimen: Nasopharyngeal Swab  Result Value Ref Range Status   SARS Coronavirus 2 NEGATIVE NEGATIVE Final    Comment: (NOTE) SARS-CoV-2 target nucleic acids are NOT DETECTED. The SARS-CoV-2 RNA is generally detectable in upper and lower respiratory specimens during the acute phase of infection. The lowest concentration of SARS-CoV-2 viral copies this assay can detect is 250 copies / mL. A negative result does not preclude SARS-CoV-2 infection and should not be used as the sole basis for treatment or other patient management decisions.  A negative result may occur with improper specimen collection / handling, submission of specimen other than nasopharyngeal swab, presence of viral mutation(s) within the areas targeted by this assay, and inadequate number of viral copies (<250 copies / mL). A negative result must be combined with clinical observations, patient history, and epidemiological information. Fact Sheet for Patients:   StrictlyIdeas.no Fact Sheet for Healthcare Providers: BankingDealers.co.za This test is not yet approved or cleared  by the [place] FDA and has been authorized for detection and/or diagnosis of SARS-CoV-2 by FDA under an Emergency Use Authorization (EUA).  This EUA will remain in effect (meaning this test can be used) for the duration of the COVID-19 declaration under Section 564(b)(1) of the Act, 21 U.S.C. section 360bbb-3(b)(1), unless the authorization is terminated or revoked sooner. Performed at [facility], [address] [place], [place] [address]      Labs: Basic Metabolic Panel: Recent Labs  Lab 04/01/20 1649 04/02/20 0504 04/03/20 0657  NA 140 140 137  K 3.8 3.8 3.9  CL 104 105 103  CO2 28 28 25   GLUCOSE 111* 122* 121*  BUN 20 19 15   CREATININE 0.95 0.99 0.83  CALCIUM 9.7 9.7 9.5  MG  --   --  2.2  PHOS  --   --  2.7   Liver Function  Tests: Recent Labs  Lab 04/03/20 0657  AST 39  ALT 39  ALKPHOS 54  BILITOT 1.2  PROT 7.4  ALBUMIN 4.2   No results for input(s): LIPASE, AMYLASE in the last 168 hours. No results for input(s): AMMONIA in the last 168 hours. CBC: Recent Labs  Lab 04/01/20 1649 04/02/20 0504 04/03/20 0657  WBC 7.9 8.7 9.7  HGB 14.8 15.0 15.9  HCT 45.3 44.1 46.8  MCV 91.7 89.3 88.5  PLT 205 195 187   Cardiac Enzymes: No results for input(s): CKTOTAL, CKMB, CKMBINDEX, TROPONINI in the last 168 hours. BNP: BNP (last 3 results) No results for input(s): BNP in the last 8760 hours.  ProBNP (last 3 results) No results for input(s): PROBNP in the last 8760 hours.  CBG: No results for input(s): GLUCAP  in the last 168 hours.     Signed:  Dia Crawford, MD [facility] [phone] pager

## 2020-04-03 NOTE — Progress Notes (Signed)
Discharge instructions explained to pt and pts wife/ verbalized an understanding/ iv and tele removed/ brilinta coupon given/ transported off unit via wheelchair.

## 2020-04-08 ENCOUNTER — Emergency Department
Admission: EM | Admit: 2020-04-08 | Discharge: 2020-04-08 | Disposition: A | Discharge status: Home / Self Care | Payer: 59 | Attending: Emergency Medicine | Admitting: Emergency Medicine

## 2020-04-08 ENCOUNTER — Emergency Department: Payer: 59

## 2020-04-08 ENCOUNTER — Other Ambulatory Visit: Payer: Self-pay

## 2020-04-08 DIAGNOSIS — Z955 Presence of coronary angioplasty implant and graft: Secondary | ICD-10-CM | POA: Diagnosis not present

## 2020-04-08 DIAGNOSIS — I10 Essential (primary) hypertension: Secondary | ICD-10-CM | POA: Diagnosis not present

## 2020-04-08 DIAGNOSIS — R079 Chest pain, unspecified: Secondary | ICD-10-CM

## 2020-04-08 DIAGNOSIS — Z87891 Personal history of nicotine dependence: Secondary | ICD-10-CM | POA: Insufficient documentation

## 2020-04-08 DIAGNOSIS — Z8546 Personal history of malignant neoplasm of prostate: Secondary | ICD-10-CM | POA: Diagnosis not present

## 2020-04-08 DIAGNOSIS — Z79899 Other long term (current) drug therapy: Secondary | ICD-10-CM | POA: Insufficient documentation

## 2020-04-08 DIAGNOSIS — Z7982 Long term (current) use of aspirin: Secondary | ICD-10-CM | POA: Insufficient documentation

## 2020-04-08 DIAGNOSIS — R0789 Other chest pain: Secondary | ICD-10-CM | POA: Insufficient documentation

## 2020-04-08 DIAGNOSIS — R0602 Shortness of breath: Secondary | ICD-10-CM | POA: Insufficient documentation

## 2020-04-08 LAB — BASIC METABOLIC PANEL
Anion gap: 9 (ref 5–15)
BUN: 27 mg/dL — ABNORMAL HIGH (ref 8–23)
CO2: 27 mmol/L (ref 22–32)
Calcium: 9.8 mg/dL (ref 8.9–10.3)
Chloride: 100 mmol/L (ref 98–111)
Creatinine, Ser: 1.37 mg/dL — ABNORMAL HIGH (ref 0.61–1.24)
GFR calc Af Amer: >60 mL/min (ref >60)
GFR calc non Af Amer: 55 mL/min — ABNORMAL LOW (ref >60)
Glucose, Bld: 162 mg/dL — ABNORMAL HIGH (ref 70–99)
Potassium: 4 mmol/L (ref 3.5–5.1)
Sodium: 136 mmol/L (ref 135–145)

## 2020-04-08 LAB — TROPONIN I (HIGH SENSITIVITY)
Troponin I (High Sensitivity): 35 ng/L — ABNORMAL HIGH (ref <18)
Troponin I (High Sensitivity): 53 ng/L — ABNORMAL HIGH (ref <18)

## 2020-04-08 LAB — CBC
HCT: 46.1 % (ref 39.0–52.0)
Hemoglobin: 15.3 g/dL (ref 13.0–17.0)
MCH: 29.3 pg (ref 26.0–34.0)
MCHC: 33.2 g/dL (ref 30.0–36.0)
MCV: 88.1 fL (ref 80.0–100.0)
Platelets: 250 10*3/uL (ref 150–400)
RBC: 5.23 MIL/uL (ref 4.22–5.81)
RDW: 12.1 % (ref 11.5–15.5)
WBC: 9.4 10*3/uL (ref 4.0–10.5)
nRBC: 0 % (ref 0.0–0.2)

## 2020-04-08 MED ORDER — SODIUM CHLORIDE 0.9% FLUSH: 3.0000 mL | Freq: Once | INTRAVENOUS | Status: DC

## 2020-04-08 NOTE — ED Provider Notes (Signed)
[facility] Emergency Department Provider Note  Time seen: 6:24 PM  I have reviewed the triage vital signs and the nursing notes.   HISTORY  Chief Complaint Chest Pain   HPI Austin Wall is a 64 y.o. male with a past medical history of arthritis, hypertension, hyperlipidemia, anxiety, recent MI status post stent 6 days ago presents to the emergency department for chest pain.  According to the patient around 4:30 PM tonight he developed 7/10 midsternal chest pain along with some mild shortness of breath.  Denies any nausea or diaphoresis.  Patient states he took 1 nitroglycerin tablet and waited approximately 20 minutes and the pain subsided somewhat down to a 4 5/10.  Patient states the pain did not go away so he came to the emergency department and took an additional nitroglycerin tablet.  Patient states the pain is currently mild 4/10 in severity but does not wish for anything for discomfort.  Patient is concerned that he could have had a another heart attack or restenosis of his stent.   Past Medical History:  Diagnosis Date  . Arthritis    left foot s/p trauma - 30 yrs ago  . Headache    stress  . Hyperlipidemia   . Hypertension   . Insomnia   . Prostate CA [facility])     Patient Active Problem List   Diagnosis Date Noted  . Unstable angina ([place]) 04/01/2020  . NSTEMI (non-ST elevated myocardial infarction) ([place]) 04/01/2020  . Anxiety 06/25/2017  . Chronic hand pain, right 09/13/2016  . Arthritis 08/14/2016  . Prostate CA ([place]) 06/24/2015  . Hypertension 06/24/2015  . Insomnia 06/24/2015  . Hyperlipidemia     Past Surgical History:  Procedure Laterality Date  . CATARACT EXTRACTION Left 02/10/15   MBSC  . CATARACT EXTRACTION W/PHACO Right 04/07/2015   Procedure: CATARACT EXTRACTION PHACO AND INTRAOCULAR LENS PLACEMENT (IOC);  Surgeon: Leandrew Koyanagi, MD;  Location: [place];  Service: Ophthalmology;  Laterality: Right;  [place]  .  COLONOSCOPY    . CORONARY STENT INTERVENTION N/A 04/02/2020   Procedure: CORONARY STENT INTERVENTION;  Surgeon: Nelva Bush, MD;  Location: [place] CV LAB;  Service: Cardiovascular;  Laterality: N/A;  . EYE SURGERY    . LEFT HEART CATH AND CORONARY ANGIOGRAPHY N/A 04/02/2020   Procedure: LEFT HEART CATH AND CORONARY ANGIOGRAPHY;  Surgeon: Corey Skains, MD;  Location: [place] CV LAB;  Service: Cardiovascular;  Laterality: N/A;  . MOUTH SURGERY     dental implants  . radiation treatment    . VASECTOMY      Prior to Admission medications   Medication Sig Start Date End Date Taking? Authorizing Provider  aspirin EC 81 MG EC tablet Take 1 tablet (81 mg total) by mouth daily. 04/04/20   Allie Bossier, MD  atorvastatin (LIPITOR) 40 MG tablet Take 1 tablet (40 mg total) by mouth daily at 6 PM. 04/03/20   Allie Bossier, MD  benazepril (LOTENSIN) 40 MG tablet Take 1 tablet (40 mg total) by mouth daily. 04/04/20   Allie Bossier, MD  Multiple Vitamin (MULTIVITAMIN) capsule Take 1 capsule by mouth daily. AM    [provider]  nitroGLYCERIN (NITROSTAT) 0.4 MG SL tablet Place 1 tablet (0.4 mg total) under the tongue every 5 (five) minutes x 3 doses as needed for chest pain. 04/03/20   Allie Bossier, MD  PARoxetine (PAXIL) 20 MG tablet Take 20 mg by mouth at bedtime. 01/16/20   [provider]  ticagrelor (BRILINTA) 90 MG TABS tablet Take 1 tablet (90 mg total) by mouth 2 (two) times daily. 04/03/20   Allie Bossier, MD    Allergies  Allergen Reactions  . Codeine Nausea Only    Family History  Problem Relation Age of Onset  . Cancer Mother   . Hypertension Mother   . Hypertension Father     Social History Social History   Tobacco Use  . Smoking status: Former Smoker    Types: Cigarettes    Quit date: 06/24/1995    Years since quitting: 24.8  . Smokeless tobacco: Never Used  Substance Use Topics  . Alcohol use: Yes    Alcohol/week: 0.0 standard  drinks    Comment: very little  . Drug use: No    Types: Marijuana    Review of Systems Constitutional: Negative for fever. Cardiovascular: Positive for chest pain, mild currently. Respiratory: Mild shortness of breath. Gastrointestinal: Negative for abdominal pain, vomiting  Musculoskeletal: Negative for musculoskeletal complaints Neurological: Negative for headache All other ROS negative  ____________________________________________   PHYSICAL EXAM:  VITAL SIGNS: ED Triage Vitals  Enc Vitals Group     BP 04/08/20 1801 110/71     Pulse Rate 04/08/20 1801 83     Resp 04/08/20 1801 17     Temp 04/08/20 1801 98.2 F (36.8 C)     Temp src --      SpO2 04/08/20 1801 100 %     Weight 04/08/20 1757 195 lb (88.5 kg)     Height 04/08/20 1757 5\' 10"  (1.778 m)     Head Circumference --      Peak Flow --      Pain Score 04/08/20 1757 5     Pain Loc --      Pain Edu? --      Excl. in [place]? --    Constitutional: Alert and oriented. Well appearing and in no distress. Eyes: Normal exam ENT      Head: Normocephalic and atraumatic.      Mouth/Throat: Mucous membranes are moist. Cardiovascular: Normal rate, regular rhythm.  Respiratory: Normal respiratory effort without tachypnea nor retractions. Breath sounds are clear  Gastrointestinal: Soft and nontender. No distention.   Musculoskeletal: Nontender with normal range of motion in all extremities. Neurologic:  Normal speech and language. No gross focal neurologic deficits  Skin:  Skin is warm, dry and intact.  Psychiatric: Mood and affect are normal.   ____________________________________________    EKG  EKG viewed and interpreted by myself shows a normal sinus rhythm at 94 bpm with a narrow QRS, normal axis, normal intervals, no concerning ST changes.  ____________________________________________    RADIOLOGY  Chest x-ray negative.  ____________________________________________   INITIAL IMPRESSION / ASSESSMENT AND  PLAN / ED COURSE  Pertinent labs & imaging results that were available during my care of the patient were reviewed by me and considered in my medical decision making (see chart for details).   Patient presents to the emergency department for acute onset of chest pain around 4:30 PM.  Recent cardiac catheterization with stent placement.  Differential would include ACS, restenosis, angina, chest wall discomfort.  We will check labs including cardiac enzymes, chest x-ray and continue to closely monitor.  Patient states mild discomfort currently does not wish for any pain medication.  Reassuringly patient's EKG appears well.  Patient's first troponin is 35.  This is down from 1406 days ago.  Repeat troponin is slightly elevated to 53.  I spoke to Dr. Ubaldo Glassing on-call for Dr. Nehemiah Massed, believe the patient would be safe for discharge home if chest pain-free.  Patient states he is chest pain-free now.  I discussed the options with the patient be admitted to the hospital for monitoring versus following up with cardiology as an outpatient.  Patient states he would rather go home as he has no chest pain.  I discussed very strict return precautions for the patient. TEODOR ANZURES was evaluated in Emergency Department on 04/08/2020 for the symptoms described in the history of present illness. He was evaluated in the context of the global COVID-19 pandemic, which necessitated consideration that the patient might be at risk for infection with the SARS-CoV-2 virus that causes COVID-19. Institutional protocols and algorithms that pertain to the evaluation of patients at risk for COVID-19 are in a state of rapid change based on information released by regulatory bodies including the CDC and federal and state organizations. These policies and algorithms were followed during the patient's care in the ED.  ____________________________________________   FINAL CLINICAL IMPRESSION(S) / ED DIAGNOSES  Chest pain   Harvest Dark, MD 04/08/20 2143

## 2020-04-08 NOTE — ED Triage Notes (Addendum)
Pt comes via POV from home with c/o CP that started around 430pm today. Pt states he just had stent placed last week. Pt states d/c home Saturday from [facility].  Pt states the pain feels like an heart attack.  Pt states he took 2 nitro with relief. Pt states central pain that doesn't radiate.  Pt states SOB

## 2020-05-05 ENCOUNTER — Encounter: Payer: Self-pay | Admitting: Internal Medicine

## 2020-05-05 MED ORDER — TICAGRELOR 90 MG PO TABS
ORAL_TABLET | ORAL | Status: DC | PRN
  Administered 2020-04-02: 180 mg via ORAL

## 2020-12-27 IMAGING — CR DG CHEST 2V
2 series · 2 of 2 positions shown · non-contrast
Comparison: 04/01/2020

CLINICAL DATA: Chest pain

EXAM:
CHEST - 2 VIEW

[chest pa]
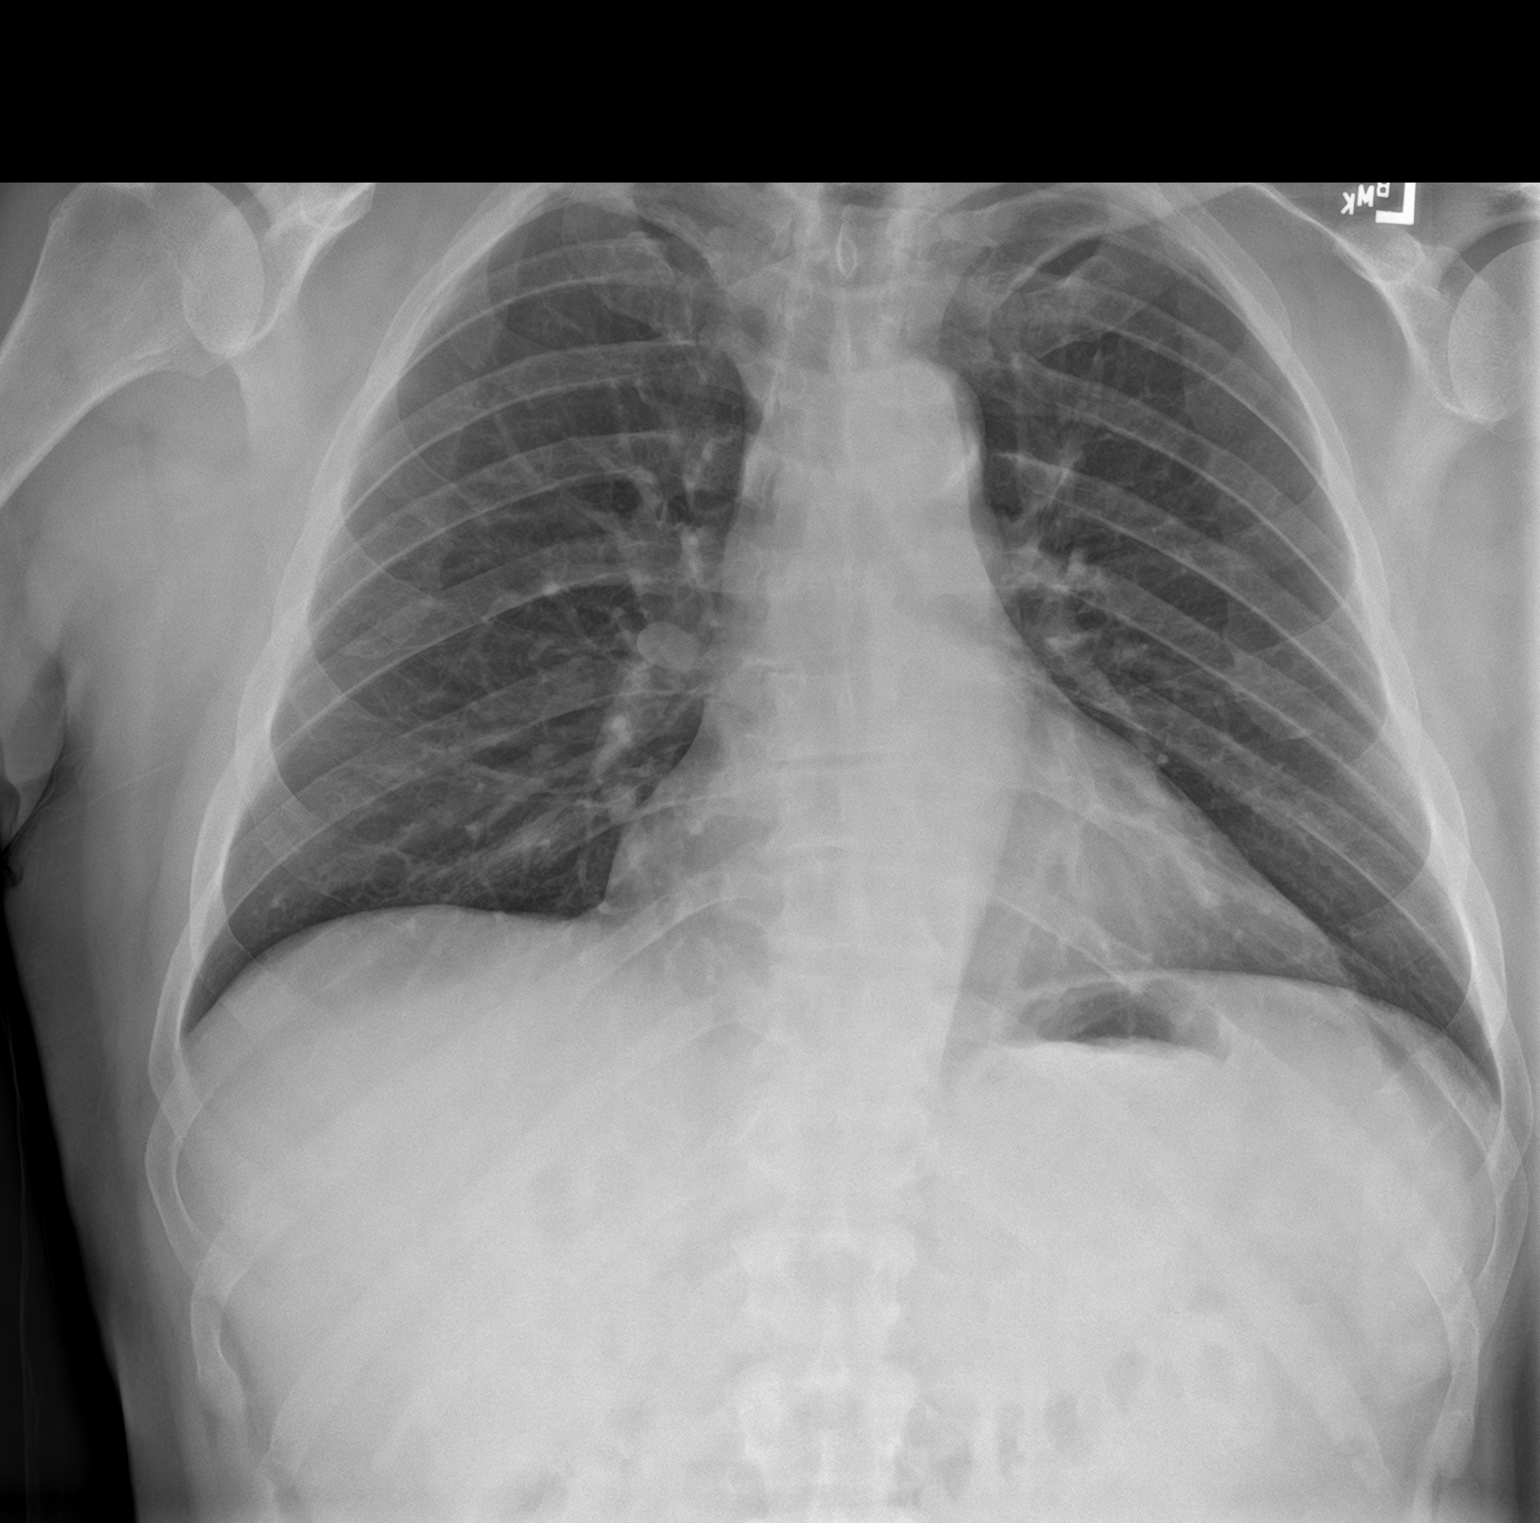

[chest lat]
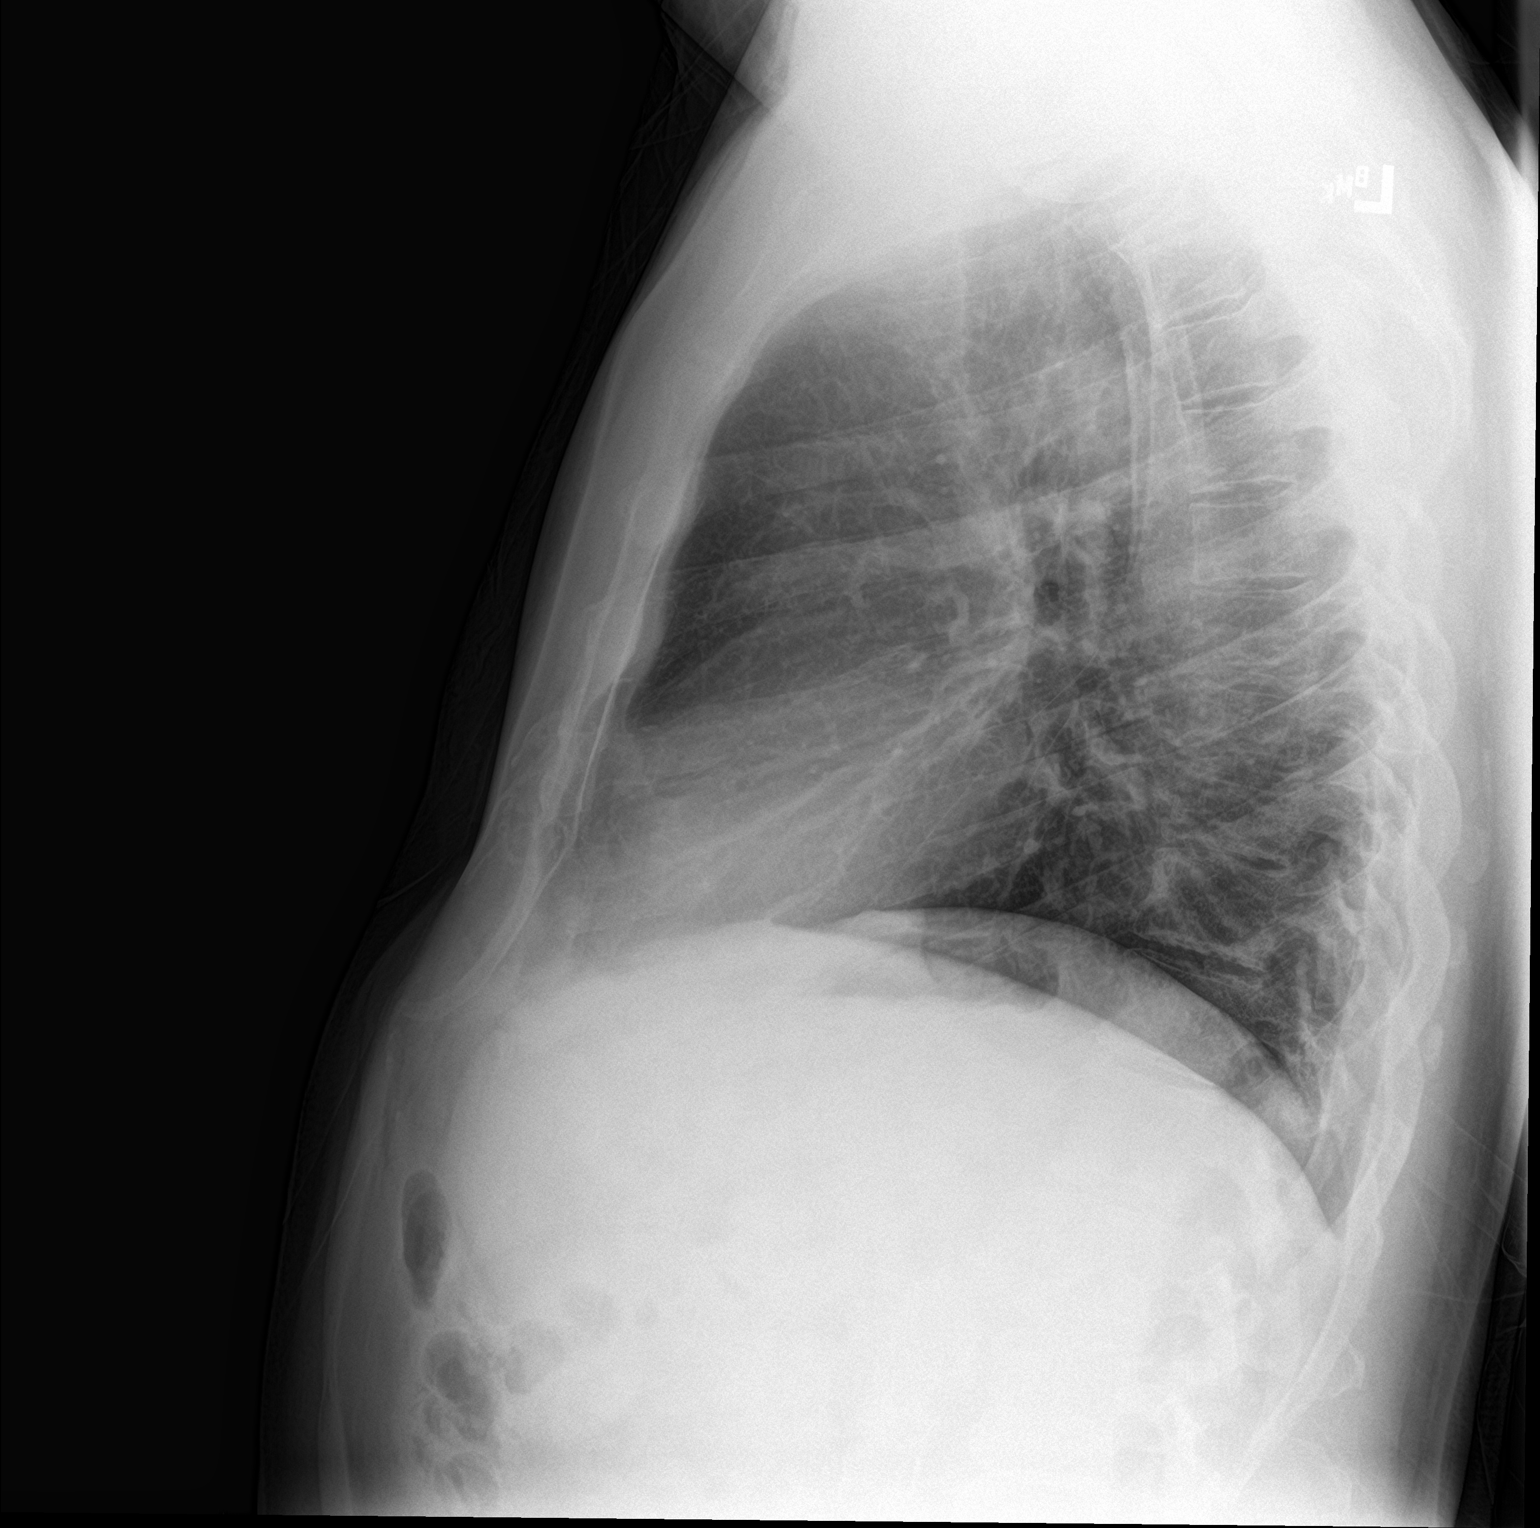

[2 of 2 positions shown; findings below may reference images not displayed]

FINDINGS: The heart size and mediastinal contours are within normal limits.
Atherosclerotic calcification of the aortic knob. Both lungs are
clear. The visualized skeletal structures are unremarkable.
IMPRESSION: No active cardiopulmonary disease.

## 2022-09-27 ENCOUNTER — Other Ambulatory Visit: Payer: Self-pay | Admitting: Orthopedic Surgery

## 2022-09-27 DIAGNOSIS — M1612 Unilateral primary osteoarthritis, left hip: Secondary | ICD-10-CM

## 2022-09-27 DIAGNOSIS — M25852 Other specified joint disorders, left hip: Secondary | ICD-10-CM

## 2022-10-27 ENCOUNTER — Other Ambulatory Visit: Payer: Self-pay

## 2022-11-16 ENCOUNTER — Ambulatory Visit
Admission: RE | Admit: 2022-11-16 | Discharge: 2022-11-16 | Disposition: A | Discharge status: Home / Self Care | Payer: Self-pay | Source: Ambulatory Visit | Attending: Orthopedic Surgery | Admitting: Orthopedic Surgery

## 2022-11-16 ENCOUNTER — Ambulatory Visit
Admission: RE | Admit: 2022-11-16 | Discharge: 2022-11-16 | Disposition: A | Discharge status: Home / Self Care | Payer: 59 | Source: Ambulatory Visit | Attending: Orthopedic Surgery | Admitting: Orthopedic Surgery

## 2022-11-16 DIAGNOSIS — M1612 Unilateral primary osteoarthritis, left hip: Secondary | ICD-10-CM

## 2022-11-16 DIAGNOSIS — M25852 Other specified joint disorders, left hip: Secondary | ICD-10-CM

## 2022-11-16 MED ORDER — IOPAMIDOL (ISOVUE-M 200) INJECTION 41%
15.0000 mL | Freq: Once | INTRAMUSCULAR | Status: AC
  Administered 2022-11-16: 15 mL via INTRA_ARTICULAR

## 2024-05-26 ENCOUNTER — Emergency Department

## 2024-05-26 ENCOUNTER — Encounter: Payer: Self-pay | Admitting: Internal Medicine

## 2024-05-26 ENCOUNTER — Observation Stay
Admission: EM | Admit: 2024-05-26 | Discharge: 2024-05-27 | Disposition: A | Discharge status: Home / Self Care | Attending: Gastroenterology | Admitting: Gastroenterology

## 2024-05-26 ENCOUNTER — Other Ambulatory Visit: Payer: Self-pay

## 2024-05-26 DIAGNOSIS — D128 Benign neoplasm of rectum: Secondary | ICD-10-CM | POA: Diagnosis not present

## 2024-05-26 DIAGNOSIS — F419 Anxiety disorder, unspecified: Secondary | ICD-10-CM | POA: Diagnosis present

## 2024-05-26 DIAGNOSIS — K922 Gastrointestinal hemorrhage, unspecified: Secondary | ICD-10-CM | POA: Insufficient documentation

## 2024-05-26 DIAGNOSIS — R6510 Systemic inflammatory response syndrome (SIRS) of non-infectious origin without acute organ dysfunction: Secondary | ICD-10-CM | POA: Diagnosis not present

## 2024-05-26 DIAGNOSIS — M1990 Unspecified osteoarthritis, unspecified site: Secondary | ICD-10-CM | POA: Diagnosis present

## 2024-05-26 DIAGNOSIS — I2510 Atherosclerotic heart disease of native coronary artery without angina pectoris: Secondary | ICD-10-CM

## 2024-05-26 DIAGNOSIS — I10 Essential (primary) hypertension: Secondary | ICD-10-CM | POA: Diagnosis present

## 2024-05-26 DIAGNOSIS — G4700 Insomnia, unspecified: Secondary | ICD-10-CM | POA: Diagnosis present

## 2024-05-26 DIAGNOSIS — K529 Noninfective gastroenteritis and colitis, unspecified: Secondary | ICD-10-CM | POA: Diagnosis not present

## 2024-05-26 DIAGNOSIS — K625 Hemorrhage of anus and rectum: Secondary | ICD-10-CM | POA: Diagnosis present

## 2024-05-26 DIAGNOSIS — E785 Hyperlipidemia, unspecified: Secondary | ICD-10-CM | POA: Diagnosis present

## 2024-05-26 DIAGNOSIS — I200 Unstable angina: Secondary | ICD-10-CM | POA: Diagnosis present

## 2024-05-26 LAB — COMPREHENSIVE METABOLIC PANEL WITH GFR
ALT: 38 U/L (ref 0–44)
AST: 37 U/L (ref 15–41)
Albumin: 4.3 g/dL (ref 3.5–5.0)
Alkaline Phosphatase: 69 U/L (ref 38–126)
Anion gap: 12 (ref 5–15)
BUN: 30 mg/dL — ABNORMAL HIGH (ref 8–23)
CO2: 24 mmol/L (ref 22–32)
Calcium: 11 mg/dL — ABNORMAL HIGH (ref 8.9–10.3)
Chloride: 101 mmol/L (ref 98–111)
Creatinine, Ser: 1.58 mg/dL — ABNORMAL HIGH (ref 0.61–1.24)
GFR, Estimated: 48 mL/min — ABNORMAL LOW (ref >60)
Glucose, Bld: 159 mg/dL — ABNORMAL HIGH (ref 70–99)
Potassium: 4.5 mmol/L (ref 3.5–5.1)
Sodium: 137 mmol/L (ref 135–145)
Total Bilirubin: 0.9 mg/dL (ref 0.0–1.2)
Total Protein: 7.7 g/dL (ref 6.5–8.1)

## 2024-05-26 LAB — CBC WITH DIFFERENTIAL/PLATELET
Abs Immature Granulocytes: 0.08 K/uL — ABNORMAL HIGH (ref 0.00–0.07)
Basophils Absolute: 0 K/uL (ref 0.0–0.1)
Basophils Relative: 0 %
Eosinophils Absolute: 0 K/uL (ref 0.0–0.5)
Eosinophils Relative: 0 %
HCT: 46.3 % (ref 39.0–52.0)
Hemoglobin: 14.9 g/dL (ref 13.0–17.0)
Immature Granulocytes: 0 %
Lymphocytes Relative: 11 %
Lymphs Abs: 2 K/uL (ref 0.7–4.0)
MCH: 29.4 pg (ref 26.0–34.0)
MCHC: 32.2 g/dL (ref 30.0–36.0)
MCV: 91.5 fL (ref 80.0–100.0)
Monocytes Absolute: 1.2 K/uL — ABNORMAL HIGH (ref 0.1–1.0)
Monocytes Relative: 7 %
Neutro Abs: 15.2 K/uL — ABNORMAL HIGH (ref 1.7–7.7)
Neutrophils Relative %: 82 %
Platelets: 275 K/uL (ref 150–400)
RBC: 5.06 MIL/uL (ref 4.22–5.81)
RDW: 12.7 % (ref 11.5–15.5)
WBC: 18.5 K/uL — ABNORMAL HIGH (ref 4.0–10.5)
nRBC: 0 % (ref 0.0–0.2)

## 2024-05-26 LAB — TROPONIN I (HIGH SENSITIVITY)
Troponin I (High Sensitivity): 11 ng/L (ref <18)
Troponin I (High Sensitivity): 10 ng/L (ref <18)

## 2024-05-26 LAB — LACTIC ACID, PLASMA: Lactic Acid, Venous: 1.5 mmol/L (ref 0.5–1.9)

## 2024-05-26 MED ORDER — EZETIMIBE 10 MG PO TABS
10.0000 mg | ORAL_TABLET | Freq: Every day | ORAL | Status: DC
  Administered 2024-05-27: 10 mg via ORAL
  Filled 2024-05-26: qty 1

## 2024-05-26 MED ORDER — ACETAMINOPHEN 650 MG RE SUPP: 650.0000 mg | Freq: Four times a day (QID) | RECTAL | Status: DC | PRN

## 2024-05-26 MED ORDER — ONDANSETRON HCL 4 MG/2ML IJ SOLN: 4.0000 mg | Freq: Four times a day (QID) | INTRAMUSCULAR | Status: DC | PRN

## 2024-05-26 MED ORDER — NITROGLYCERIN 0.4 MG SL SUBL: 0.4000 mg | SUBLINGUAL_TABLET | SUBLINGUAL | Status: DC | PRN

## 2024-05-26 MED ORDER — HYDRALAZINE HCL 20 MG/ML IJ SOLN: 5.0000 mg | Freq: Four times a day (QID) | INTRAMUSCULAR | Status: DC | PRN

## 2024-05-26 MED ORDER — ONDANSETRON HCL 4 MG PO TABS: 4.0000 mg | ORAL_TABLET | Freq: Four times a day (QID) | ORAL | Status: DC | PRN

## 2024-05-26 MED ORDER — PAROXETINE HCL 30 MG PO TABS
30.0000 mg | ORAL_TABLET | Freq: Every day | ORAL | Status: DC
Start: 2024-05-26 — End: 2024-05-27
  Administered 2024-05-26: 30 mg via ORAL
  Filled 2024-05-26: qty 1

## 2024-05-26 MED ORDER — BUSPIRONE HCL 10 MG PO TABS
5.0000 mg | ORAL_TABLET | Freq: Two times a day (BID) | ORAL | Status: DC
  Administered 2024-05-26 – 2024-05-27 (×2): 5 mg via ORAL
  Filled 2024-05-26 (×2): qty 1

## 2024-05-26 MED ORDER — LACTATED RINGERS IV SOLN: INTRAVENOUS | Status: DC

## 2024-05-26 MED ORDER — ACETAMINOPHEN 325 MG PO TABS: 650.0000 mg | ORAL_TABLET | Freq: Four times a day (QID) | ORAL | Status: DC | PRN

## 2024-05-26 MED ORDER — ATORVASTATIN CALCIUM 80 MG PO TABS
80.0000 mg | ORAL_TABLET | Freq: Every day | ORAL | Status: DC
Start: 2024-05-27 — End: 2024-05-27
  Administered 2024-05-27: 80 mg via ORAL
  Filled 2024-05-26: qty 1

## 2024-05-26 MED ORDER — AMLODIPINE BESYLATE 5 MG PO TABS
5.0000 mg | ORAL_TABLET | Freq: Every day | ORAL | Status: DC
  Administered 2024-05-27: 5 mg via ORAL
  Filled 2024-05-26: qty 1

## 2024-05-26 MED ORDER — ADULT MULTIVITAMIN W/MINERALS CH
1.0000 | ORAL_TABLET | Freq: Every day | ORAL | Status: DC
  Administered 2024-05-27: 1 via ORAL
  Filled 2024-05-26: qty 1

## 2024-05-26 MED ORDER — PIPERACILLIN-TAZOBACTAM 3.375 G IVPB
3.3750 g | Freq: Once | INTRAVENOUS | Status: AC
  Administered 2024-05-26: 3.375 g via INTRAVENOUS
  Filled 2024-05-26: qty 50

## 2024-05-26 MED ORDER — MELATONIN 5 MG PO TABS
5.0000 mg | ORAL_TABLET | Freq: Every evening | ORAL | Status: DC | PRN
  Administered 2024-05-26: 5 mg via ORAL
  Filled 2024-05-26: qty 1

## 2024-05-26 MED ORDER — IOHEXOL 350 MG/ML SOLN
100.0000 mL | Freq: Once | INTRAVENOUS | Status: AC | PRN
  Administered 2024-05-26: 100 mL via INTRAVENOUS

## 2024-05-26 NOTE — Assessment & Plan Note (Signed)
-   As needed melatonin ordered

## 2024-05-26 NOTE — Assessment & Plan Note (Signed)
 Home atorvastatin  80 mg nightly with

## 2024-05-26 NOTE — Assessment & Plan Note (Signed)
 Home amlodipine  5 mg daily resumed Benazepril  40 mg daily not resumed on admission in setting of acute kidney injury, a.m. team to resume when benefits outweigh the risk Hydralazine  5 mg IV every 6 hours as needed for SBP greater 170, 5 days

## 2024-05-26 NOTE — Assessment & Plan Note (Addendum)
 Status post DES PCI to proximal/mid LAD in May 2021 Patient currently on aspirin  81 mg daily which has not been resumed in setting of lower GI bleed AM team to resume when benefits outweigh the risk Home atorvastatin  80 mg daily resumed on admission

## 2024-05-26 NOTE — H&P (Addendum)
 History and Physical   [name]:[id number] DOB: 02/26/1956 DOA: 05/26/2024  PCP: Alla Amis, MD  Patient coming from: Home  I have personally briefly reviewed patient's old medical records in [facility] EMR.  Chief Concern: Bright red blood per rectum  HPI: Mr. Austin Wall is a 68 year old male with history of hypertension, hyperlipidemia, CAD on aspirin  and Brilinta , who presents emergency department for chief concerns of bright red blood per rectum.  Vitals in the ED showed T of 98, rr of 18, heart rate 110, blood pressure 140/118, SpO2 98% on room air.  Serum sodium is 137, potassium 4.8, chloride 101, bicarb 24, BUN of 30, serum creatinine 1.58, EGFR 48, nonfasting blood glucose 159, WBC 18.5, hemoglobin 14.9, platelet 275.  HS troponin was 11 and on repeat is 10.  Lactic acid 1.5.  ED treatment: Zosyn  3.375 g IV one-time dose. --------------------------------- At bedside, patient able to tell me his first and last name, age, location, current calendar year.  He reports passing bright red blood per rectum at midnight this morning.  He reports it continued throughout the day.  He reports this is never happened before.  He reports his last colonoscopy he was just told he 'had no polyps and it was the best you could know for'.  He reports he has not been told he had hemorrhoids.  He denies fever, nausea, vomiting, dysuria, hematuria, chills, shortness of breath.  He reports no chest pain at this time.  Social history: He lives at home with his wife.  He denies tobacco, EtOH, recreational drug use.  He is semiretired and works part-time at [employer] he started.  ROS: Constitutional: no weight change, no fever ENT/Mouth: no sore throat, no rhinorrhea Eyes: no eye pain, no vision changes Cardiovascular: no chest pain, no dyspnea,  no edema, no palpitations Respiratory: no cough, no sputum, no wheezing Gastrointestinal: no nausea, no vomiting, no diarrhea,  no constipation Genitourinary: no urinary incontinence, no dysuria, no hematuria, + BRBPR Musculoskeletal: no arthralgias, no myalgias Skin: no skin lesions, no pruritus, Neuro: + weakness, no loss of consciousness, no syncope Psych: no anxiety, no depression, + decrease appetite Heme/Lymph: no bruising, no bleeding  ED Course: Discussed with EDP, patient requiring hospitalization for chief concerns of bright red blood per rectum.  Assessment/Plan  Principal Problem:   Lower GI bleed Active Problems:   Hyperlipidemia   Hypertension   Insomnia   Arthritis   Anxiety   Unstable angina (HCC)   CAD S/P percutaneous coronary angioplasty   Assessment and Plan:  * Lower GI bleed Strict I's and O's PIV: Please ensure and maintain 2 peripheral IV for patient (prefer large-bore) Gastroenterology has been consulted via staff message timed for delivery on 05/27/24 at 7am. Epic order for gastroenterology service has been placed Recheck CBC in a.m.  CAD S/P percutaneous coronary angioplasty Status post DES PCI to proximal/mid LAD in May 2021 Patient currently on aspirin  81 mg daily which has not been resumed in setting of lower GI bleed AM team to resume when benefits outweigh the risk Home atorvastatin  80 mg daily resumed on admission  Unstable angina (HCC) Home nitroglycerin  as needed reason  Anxiety Home buspirone  5 mg p.o. twice daily, paroxetine  30 mg nightly were resumed on admission  Insomnia As needed melatonin ordered  Hypertension Home amlodipine  5 mg daily resumed Benazepril  40 mg daily not resumed on admission in setting of acute kidney injury, a.m. team to resume when benefits outweigh the risk Hydralazine   5 mg IV every 6 hours as needed for SBP greater 170, 5 days  Hyperlipidemia Home atorvastatin  80 mg nightly with  Chart reviewed.   DVT prophylaxis: TED hose Code Status: Full code Diet: Heart healthy Family Communication: A phone call was offered, patient  declined stating that his wife knows he is being admitted to the hospital. Disposition Plan: Pending clinical course Consults called: None at this time Admission status: Telemetry medical, observation  Past Medical History:  Diagnosis Date   Arthritis    left foot s/p trauma - 30 yrs ago   Headache    stress   Hyperlipidemia    Hypertension    Insomnia    Prostate CA [facility])    Past Surgical History:  Procedure Laterality Date   CATARACT EXTRACTION Left 02/10/15   MBSC   CATARACT EXTRACTION W/PHACO Right 04/07/2015   Procedure: CATARACT EXTRACTION PHACO AND INTRAOCULAR LENS PLACEMENT (IOC);  Surgeon: Dene Etienne, MD;  Location: [facility];  Service: Ophthalmology;  Laterality: Right;  [place]   COLONOSCOPY     CORONARY STENT INTERVENTION N/A 04/02/2020   Procedure: CORONARY STENT INTERVENTION;  Surgeon: Mady Bruckner, MD;  Location: [facility] INVASIVE CV LAB;  Service: Cardiovascular;  Laterality: N/A;   EYE SURGERY     LEFT HEART CATH AND CORONARY ANGIOGRAPHY N/A 04/02/2020   Procedure: LEFT HEART CATH AND CORONARY ANGIOGRAPHY;  Surgeon: Hester Wolm PARAS, MD;  Location: [facility] INVASIVE CV LAB;  Service: Cardiovascular;  Laterality: N/A;   MOUTH SURGERY     dental implants   radiation treatment     VASECTOMY     Social History:  reports that he quit smoking about 28 years ago. His smoking use included cigarettes. He has never used smokeless tobacco. He reports current alcohol use. He reports that he does not use drugs.  Allergies  Allergen Reactions   Codeine Nausea Only   Family History  Problem Relation Age of Onset   Cancer Mother    Hypertension Mother    Hypertension Father    Family history: Family history reviewed and not pertinent.  Prior to Admission medications   Medication Sig Start Date End Date Taking? Authorizing Provider  amLODipine  (NORVASC ) 5 MG tablet Take 5 mg by mouth daily.   Yes [provider]  aspirin  EC 81 MG EC tablet Take  1 tablet (81 mg total) by mouth daily. 04/04/20  Yes Milissa Tod PARAS, MD  atorvastatin  (LIPITOR ) 80 MG tablet Take 80 mg by mouth.  TAKE 1 TABLET (80 MG TOTAL) BY MOUTH ONCE DAILY. 05/26/24 05/26/25 Yes [provider]  benazepril  (LOTENSIN ) 40 MG tablet Take 1 tablet (40 mg total) by mouth daily. 04/04/20  Yes Milissa Tod PARAS, MD  busPIRone  (BUSPAR ) 5 MG tablet Take 5 mg by mouth 2 (two) times daily. 12/13/23  Yes [provider]  ezetimibe  (ZETIA ) 10 MG tablet Take 10 mg by mouth daily.   Yes [provider]  PARoxetine  (PAXIL ) 30 MG tablet Take 30 mg by mouth at bedtime.   Yes [provider]  Multiple Vitamin (MULTIVITAMIN) capsule Take 1 capsule by mouth daily. AM    [provider]  nitroGLYCERIN  (NITROSTAT ) 0.4 MG SL tablet Place 1 tablet (0.4 mg total) under the tongue every 5 (five) minutes x 3 doses as needed for chest pain. Patient not taking: Reported on 05/26/2024 04/03/20   Milissa Tod PARAS, MD  ticagrelor  (BRILINTA ) 90 MG TABS tablet Take 1 tablet (90 mg total) by mouth 2 (two)  times daily. Patient not taking: Reported on 05/26/2024 04/03/20   Milissa Tod PARAS, MD   Physical Exam: Vitals:   05/26/24 1845 05/26/24 1900 05/26/24 1915 05/26/24 1941  BP:  (!) 143/75  (!) 146/85  Pulse:  84 90 88  Resp:   20 18  Temp:      SpO2: 96% 92% 93% 94%   Constitutional: appears age-appropriate, NAD, calm Eyes: PERRL, lids and conjunctivae normal ENMT: Mucous membranes are moist. Posterior pharynx clear of any exudate or lesions. Age-appropriate dentition. Hearing appropriate Neck: normal, supple, no masses, no thyromegaly Respiratory: clear to auscultation bilaterally, no wheezing, no crackles. Normal respiratory effort. No accessory muscle use.  Cardiovascular: Regular rate and rhythm, no murmurs / rubs / gallops. No extremity edema. 2+ pedal pulses. No carotid bruits.  Abdomen: no tenderness, no masses palpated, no hepatosplenomegaly. Bowel sounds  positive.  Musculoskeletal: no clubbing / cyanosis. No joint deformity upper and lower extremities. Good ROM, no contractures, no atrophy. Normal muscle tone.  Skin: no rashes, lesions, ulcers. No induration Neurologic: Sensation intact. Strength 5/5 in all 4.  Psychiatric: Normal judgment and insight. Alert and oriented x 3. Normal mood.   EKG: independently reviewed, showing sinus tachycardia with rate of 110, QTc 433  Chest x-ray on Admission: I personally reviewed and I agree with radiologist reading as below.  CT ANGIO GI BLEED Result Date: 05/26/2024 CLINICAL DATA:  Diverticulitis.  Concern for active GI bleeding. EXAM: CTA ABDOMEN AND PELVIS WITHOUT AND WITH CONTRAST TECHNIQUE: Multidetector CT imaging of the abdomen and pelvis was performed using the standard protocol during bolus administration of intravenous contrast. Multiplanar reconstructed images and MIPs were obtained and reviewed to evaluate the vascular anatomy. RADIATION DOSE REDUCTION: This exam was performed according to the departmental dose-optimization program which includes automated exposure control, adjustment of the mA and/or kV according to patient size and/or use of iterative reconstruction technique. CONTRAST:  OMNIPAQUE  IOHEXOL  350 MG/ML SOLN COMPARISON:  None Available. FINDINGS: VASCULAR Aorta: Normal caliber aorta without aneurysm, dissection, vasculitis or significant stenosis. Celiac: Patent without evidence of aneurysm, dissection, vasculitis or significant stenosis. SMA: Patent without evidence of aneurysm, dissection, vasculitis or significant stenosis. Renals: Both renal arteries are patent without evidence of aneurysm, dissection, vasculitis, fibromuscular dysplasia or significant stenosis. IMA: Patent without evidence of aneurysm, dissection, vasculitis or significant stenosis. Inflow: Patent without evidence of aneurysm, dissection, vasculitis or significant stenosis. Veins: No obvious venous abnormality  within the limitations of this arterial phase study. Review of the MIP images confirms the above findings. NON-VASCULAR Lower chest: Lung bases are clear. Hepatobiliary: No focal hepatic lesion. Normal gallbladder. No biliary duct dilatation. Common bile duct is normal. Pancreas: Pancreas is normal. No ductal dilatation. No pancreatic inflammation. Spleen: Normal spleen Adrenals/urinary tract: Adrenal glands and kidneys are normal. The ureters and bladder normal. Stomach/Bowel: No evidence of active bleeding into the stomach duodenum or small bowel. Small amount of high-density stool is present in the cecum on noncontrast imaging. No evidence of active bleeding into the RIGHT colon or LEFT colon. No evidence of rectosigmoid colon bleeding on heart arterial phase imaging. On venous phase imaging, no pooling of contrast in the GI tract. No evidence of acute diverticulitis. Minimal diverticular disease. There is mild inflammation associated with the distal descending colon and proximal sigmoid colon with subtle haziness in the adjacent mesenteric fat (images 151 through 187 of series 9). Vascular/Lymphatic: Abdominal aorta is normal caliber with atherosclerotic calcification. There is no retroperitoneal or periportal lymphadenopathy. No pelvic  lymphadenopathy. Reproductive: Prostate unremarkable Other: No free fluid. Musculoskeletal: No aggressive osseous lesion. IMPRESSION: 1. No evidence of active GI bleeding. 2. No evidence of acute diverticulitis. 3. Mild inflammation associated with the distal descending colon and proximal sigmoid colon. Mild segmental colitis. 4.  Aortic Atherosclerosis (ICD10-I70.0). Electronically Signed   By: Jackquline Boxer M.D.   On: 05/26/2024 19:51   Labs on Admission: I have personally reviewed following labs  CBC: Recent Labs  Lab 05/26/24 1726  WBC 18.5*  NEUTROABS 15.2*  HGB 14.9  HCT 46.3  MCV 91.5  PLT 275   Basic Metabolic Panel: Recent Labs  Lab 05/26/24 1726   NA 137  K 4.5  CL 101  CO2 24  GLUCOSE 159*  BUN 30*  CREATININE 1.58*  CALCIUM  11.0*   GFR: CrCl cannot be calculated (Unknown ideal weight.).  Liver Function Tests: Recent Labs  Lab 05/26/24 1726  AST 37  ALT 38  ALKPHOS 69  BILITOT 0.9  PROT 7.7  ALBUMIN 4.3   Urine analysis:    Component Value Date/Time   APPEARANCEUR Clear 07/10/2019 0848   GLUCOSEU Negative 07/10/2019 0848   BILIRUBINUR Negative 07/10/2019 0848   PROTEINUR Negative 07/10/2019 0848   NITRITE Negative 07/10/2019 0848   LEUKOCYTESUR Negative 07/10/2019 0848   This document was prepared using Dragon Voice Recognition software and may include unintentional dictation errors.  Dr. Sherre [facility]  If 7PM-7AM, please contact overnight-coverage provider If 7AM-7PM, please contact day attending provider [email or web address]  05/26/2024, 9:51 PM

## 2024-05-26 NOTE — Assessment & Plan Note (Signed)
 Home nitroglycerin  as needed reason

## 2024-05-26 NOTE — ED Provider Notes (Signed)
 [facility] Provider Note    Event Date/Time   First MD Initiated Contact with Patient 05/26/24 1813     (approximate)   History   Blood In Stools and Chest Pain   HPI  Austin Wall is a 68 y.o. male who presents to the ED for evaluation of Blood In Stools and Chest Pain   Review a routine PCP visit from March.  History of obesity, HTN, borderline DM, HLD, anxiety.  History CAD s/p NSTEMI, PCI and stenting to LAD 2021.  Patient presents to the ED for evaluation of about 18 hours of nausea and bloody stool.  Symptoms started overnight last night around midnight.  He reports sudden sensation of nausea, running to the toilet and having multiple episodes of nonbloody nonbilious emesis.  Shortly thereafter developed diarrhea and had recurrent bowel movements.  Reports they were initially nonbloody for the first few but subsequently have been bright red blood per rectum.  He estimates 75 episodes of small-volume, few tablespoons at a time, strait bright red blood per rectum without any stool throughout the day today.  Who presents to the ED due to dizziness.  While in the lobby he reports a brief episode of chest discomfort that has since resolved and he minimizes this.  Has had some LLQ cramping with stooling but no persistent abdominal pain.  No more emesis since last night.   Physical Exam   Triage Vital Signs: ED Triage Vitals  Encounter Vitals Group     BP 05/26/24 1724 (!) 140/118     Girls Systolic BP Percentile --      Girls Diastolic BP Percentile --      Boys Systolic BP Percentile --      Boys Diastolic BP Percentile --      Pulse Rate 05/26/24 1724 (!) 110     Resp 05/26/24 1724 18     Temp 05/26/24 1724 98 F (36.7 C)     Temp src --      SpO2 05/26/24 1724 98 %     Weight --      Height --      Head Circumference --      Peak Flow --      Pain Score 05/26/24 1722 3     Pain Loc --      Pain Education --      Exclude from Growth  Chart --     Most recent vital signs: Vitals:   05/26/24 1915 05/26/24 1941  BP:  (!) 146/85  Pulse: 90 88  Resp: 20 18  Temp:    SpO2: 93% 94%    General: Awake, no distress.  Well-appearing and conversational. CV:  Good peripheral perfusion.  Resp:  Normal effort.  Abd:  No distention.  Mild LLQ tenderness without guarding or peritoneal features MSK:  No deformity noted.  Neuro:  No focal deficits appreciated. Other:     ED Results / Procedures / Treatments   Labs (all labs ordered are listed, but only abnormal results are displayed) Labs Reviewed  COMPREHENSIVE METABOLIC PANEL WITH GFR - Abnormal; Notable for the following components:      Result Value   Glucose, Bld 159 (*)    BUN 30 (*)    Creatinine, Ser 1.58 (*)    Calcium  11.0 (*)    GFR, Estimated 48 (*)    All other components within normal limits  CBC WITH DIFFERENTIAL/PLATELET - Abnormal; Notable for the following components:  WBC 18.5 (*)    Neutro Abs 15.2 (*)    Monocytes Absolute 1.2 (*)    Abs Immature Granulocytes 0.08 (*)    All other components within normal limits  CULTURE, BLOOD (ROUTINE X 2)  CULTURE, BLOOD (ROUTINE X 2)  LACTIC ACID, PLASMA  URINALYSIS, ROUTINE W REFLEX MICROSCOPIC  TROPONIN I (HIGH SENSITIVITY)  TROPONIN I (HIGH SENSITIVITY)    EKG Sinus tachycardia with rate of 110 bpm.  Normal axis and intervals.  No clear signs of acute ischemia.  RADIOLOGY CTA GI bleed without clear signs of GI bleeding.  Signs of colitis are present  Official radiology report(s): CT ANGIO GI BLEED Result Date: 05/26/2024 CLINICAL DATA:  Diverticulitis.  Concern for active GI bleeding. EXAM: CTA ABDOMEN AND PELVIS WITHOUT AND WITH CONTRAST TECHNIQUE: Multidetector CT imaging of the abdomen and pelvis was performed using the standard protocol during bolus administration of intravenous contrast. Multiplanar reconstructed images and MIPs were obtained and reviewed to evaluate the vascular anatomy.  RADIATION DOSE REDUCTION: This exam was performed according to the departmental dose-optimization program which includes automated exposure control, adjustment of the mA and/or kV according to patient size and/or use of iterative reconstruction technique. CONTRAST:  OMNIPAQUE  IOHEXOL  350 MG/ML SOLN COMPARISON:  None Available. FINDINGS: VASCULAR Aorta: Normal caliber aorta without aneurysm, dissection, vasculitis or significant stenosis. Celiac: Patent without evidence of aneurysm, dissection, vasculitis or significant stenosis. SMA: Patent without evidence of aneurysm, dissection, vasculitis or significant stenosis. Renals: Both renal arteries are patent without evidence of aneurysm, dissection, vasculitis, fibromuscular dysplasia or significant stenosis. IMA: Patent without evidence of aneurysm, dissection, vasculitis or significant stenosis. Inflow: Patent without evidence of aneurysm, dissection, vasculitis or significant stenosis. Veins: No obvious venous abnormality within the limitations of this arterial phase study. Review of the MIP images confirms the above findings. NON-VASCULAR Lower chest: Lung bases are clear. Hepatobiliary: No focal hepatic lesion. Normal gallbladder. No biliary duct dilatation. Common bile duct is normal. Pancreas: Pancreas is normal. No ductal dilatation. No pancreatic inflammation. Spleen: Normal spleen Adrenals/urinary tract: Adrenal glands and kidneys are normal. The ureters and bladder normal. Stomach/Bowel: No evidence of active bleeding into the stomach duodenum or small bowel. Small amount of high-density stool is present in the cecum on noncontrast imaging. No evidence of active bleeding into the RIGHT colon or LEFT colon. No evidence of rectosigmoid colon bleeding on heart arterial phase imaging. On venous phase imaging, no pooling of contrast in the GI tract. No evidence of acute diverticulitis. Minimal diverticular disease. There is mild inflammation associated with  the distal descending colon and proximal sigmoid colon with subtle haziness in the adjacent mesenteric fat (images 151 through 187 of series 9). Vascular/Lymphatic: Abdominal aorta is normal caliber with atherosclerotic calcification. There is no retroperitoneal or periportal lymphadenopathy. No pelvic lymphadenopathy. Reproductive: Prostate unremarkable Other: No free fluid. Musculoskeletal: No aggressive osseous lesion. IMPRESSION: 1. No evidence of active GI bleeding. 2. No evidence of acute diverticulitis. 3. Mild inflammation associated with the distal descending colon and proximal sigmoid colon. Mild segmental colitis. 4.  Aortic Atherosclerosis (ICD10-I70.0). Electronically Signed   By: Jackquline Boxer M.D.   On: 05/26/2024 19:51    PROCEDURES and INTERVENTIONS:  .Critical Care  Performed by: Claudene Rover, MD Authorized by: Claudene Rover, MD   Critical care provider statement:    Critical care time (minutes):  30   Critical care time was exclusive of:  Separately billable procedures and treating other patients   Critical care was necessary to  treat or prevent imminent or life-threatening deterioration of the following conditions:  Sepsis   Critical care was time spent personally by me on the following activities:  Development of treatment plan with patient or surrogate, discussions with consultants, evaluation of patient's response to treatment, examination of patient, ordering and review of laboratory studies, ordering and review of radiographic studies, ordering and performing treatments and interventions, pulse oximetry, re-evaluation of patient's condition and review of old charts .1-3 Lead EKG Interpretation  Performed by: Claudene Rover, MD Authorized by: Claudene Rover, MD     Interpretation: normal     ECG rate:  90   ECG rate assessment: normal     Rhythm: sinus rhythm     Ectopy: none     Conduction: normal     Medications  piperacillin -tazobactam (ZOSYN ) IVPB 3.375 g (has no  administration in time range)  iohexol  (OMNIPAQUE ) 350 MG/ML injection 100 mL (100 mLs Intravenous Contrast Given 05/26/24 1934)     IMPRESSION / MDM / ASSESSMENT AND PLAN / ED COURSE  I reviewed the triage vital signs and the nursing notes.  Differential diagnosis includes, but is not limited to, colitis, diverticulitis, AVM, sepsis, NSTEMI  {Patient presents with symptoms of an acute illness or injury that is potentially life-threatening.  68 year old with coronary history presents with signs of lower GI bleeding, sepsis and colitis requiring medical admission.  Brief spell of chest pain while in our lobby but this does not persist.  Nonischemic EKG and first troponin is low.  Second troponin is pending.  CT with signs of colitis but no clear extravasation to demonstrate active bleeding.  He does still have active hematochezia though, frequent small-volume red bloody output.  Leukocytosis, prerenal azotemia but normal lactic acid.  Consult with medicine for admission.  Provide Zosyn  after cultures  Clinical Course as of 05/26/24 2016  Mon May 26, 2024  2014 Reassessed and discussed CT results, colitis, leukocytosis, criteria and possible etiologies of his symptoms.  Discussed plans of care and he is agreeable with admission [DS]    Clinical Course User Index [DS] Claudene Rover, MD     FINAL CLINICAL IMPRESSION(S) / ED DIAGNOSES   Final diagnoses:  Colitis  Lower GI bleeding  SIRS (systemic inflammatory response syndrome) (HCC)     Rx / DC Orders   ED Discharge Orders     None        Note:  This document was prepared using Dragon voice recognition software and may include unintentional dictation errors.   Claudene Rover, MD 05/26/24 2016

## 2024-05-26 NOTE — Assessment & Plan Note (Addendum)
 Home buspirone  5 mg p.o. twice daily, paroxetine  30 mg nightly were resumed on admission

## 2024-05-26 NOTE — ED Triage Notes (Signed)
 States having bright red blood in his stools that started last night at 12 midnight. Woke up with N/V, waking up every 15 - 20 min. States he has been having chest pain persistently. While in triage waiting says he took Nitroglycerin  tablet x 1 and it helped the pain.

## 2024-05-26 NOTE — Hospital Course (Addendum)
 Mr. Austin Wall is a 68 year old male with history of hypertension, hyperlipidemia, CAD on aspirin  and Brilinta , who presents emergency department for chief concerns of bright red blood per rectum.  Vitals in the ED showed T of 98, rr of 18, heart rate 110, blood pressure 140/118, SpO2 98% on room air.  Serum sodium is 137, potassium 4.8, chloride 101, bicarb 24, BUN of 30, serum creatinine 1.58, EGFR 48, nonfasting blood glucose 159, WBC 18.5, hemoglobin 14.9, platelet 275.  HS troponin was 11 and on repeat is 10.  Lactic acid 1.5.  ED treatment: Zosyn  3.375 g IV one-time dose.

## 2024-05-26 NOTE — Assessment & Plan Note (Addendum)
 Strict I's and O's PIV: Please ensure and maintain 2 peripheral IV for patient (prefer large-bore) Gastroenterology has been consulted via staff message timed for delivery on 05/27/24 at 7am. Epic order for gastroenterology service has been placed Recheck CBC in a.m.

## 2024-05-27 ENCOUNTER — Encounter
Admission: EM | Disposition: A | Discharge status: Home / Self Care | Payer: Self-pay | Source: Home / Self Care | Attending: Emergency Medicine

## 2024-05-27 ENCOUNTER — Observation Stay: Admitting: Anesthesiology

## 2024-05-27 DIAGNOSIS — K529 Noninfective gastroenteritis and colitis, unspecified: Principal | ICD-10-CM

## 2024-05-27 DIAGNOSIS — D128 Benign neoplasm of rectum: Principal | ICD-10-CM

## 2024-05-27 DIAGNOSIS — K625 Hemorrhage of anus and rectum: Secondary | ICD-10-CM | POA: Diagnosis not present

## 2024-05-27 DIAGNOSIS — K922 Gastrointestinal hemorrhage, unspecified: Secondary | ICD-10-CM | POA: Diagnosis not present

## 2024-05-27 HISTORY — PX: COLONOSCOPY: SHX5424

## 2024-05-27 HISTORY — PX: POLYPECTOMY: SHX149

## 2024-05-27 LAB — CBC
HCT: 39.7 % (ref 39.0–52.0)
Hemoglobin: 13.2 g/dL (ref 13.0–17.0)
MCH: 30.4 pg (ref 26.0–34.0)
MCHC: 33.2 g/dL (ref 30.0–36.0)
MCV: 91.5 fL (ref 80.0–100.0)
Platelets: 216 K/uL (ref 150–400)
RBC: 4.34 MIL/uL (ref 4.22–5.81)
RDW: 12.8 % (ref 11.5–15.5)
WBC: 15.6 K/uL — ABNORMAL HIGH (ref 4.0–10.5)
nRBC: 0 % (ref 0.0–0.2)

## 2024-05-27 LAB — URINALYSIS, ROUTINE W REFLEX MICROSCOPIC
Bilirubin Urine: NEGATIVE
Glucose, UA: NEGATIVE mg/dL
Hgb urine dipstick: NEGATIVE
Ketones, ur: NEGATIVE mg/dL
Leukocytes,Ua: NEGATIVE
Nitrite: NEGATIVE
Protein, ur: NEGATIVE mg/dL
Specific Gravity, Urine: >1.046 — ABNORMAL HIGH (ref 1.005–1.030)
pH: 5 (ref 5.0–8.0)

## 2024-05-27 LAB — BASIC METABOLIC PANEL WITH GFR
Anion gap: 12 (ref 5–15)
BUN: 30 mg/dL — ABNORMAL HIGH (ref 8–23)
CO2: 25 mmol/L (ref 22–32)
Calcium: 10.4 mg/dL — ABNORMAL HIGH (ref 8.9–10.3)
Chloride: 102 mmol/L (ref 98–111)
Creatinine, Ser: 1.27 mg/dL — ABNORMAL HIGH (ref 0.61–1.24)
GFR, Estimated: >60 mL/min (ref >60)
Glucose, Bld: 143 mg/dL — ABNORMAL HIGH (ref 70–99)
Potassium: 4.1 mmol/L (ref 3.5–5.1)
Sodium: 139 mmol/L (ref 135–145)

## 2024-05-27 SURGERY — COLONOSCOPY
Anesthesia: General

## 2024-05-27 MED ORDER — POLYETHYLENE GLYCOL 3350 17 G PO PACK: 34.0000 g | PACK | ORAL | Status: DC

## 2024-05-27 MED ORDER — SODIUM CHLORIDE 0.9 % IV SOLN: INTRAVENOUS | Status: DC

## 2024-05-27 MED ORDER — EPHEDRINE SULFATE-NACL 50-0.9 MG/10ML-% IV SOSY
PREFILLED_SYRINGE | INTRAVENOUS | Status: DC | PRN
  Administered 2024-05-27: 10 mg via INTRAVENOUS
  Administered 2024-05-27: 15 mg via INTRAVENOUS

## 2024-05-27 MED ORDER — LIDOCAINE HCL (CARDIAC) PF 100 MG/5ML IV SOSY
PREFILLED_SYRINGE | INTRAVENOUS | Status: DC | PRN
  Administered 2024-05-27: 80 mg via INTRAVENOUS

## 2024-05-27 MED ORDER — DEXMEDETOMIDINE HCL IN NACL 80 MCG/20ML IV SOLN
INTRAVENOUS | Status: DC | PRN
  Administered 2024-05-27: 20 ug via INTRAVENOUS

## 2024-05-27 MED ORDER — PROPOFOL 10 MG/ML IV BOLUS
INTRAVENOUS | Status: DC | PRN
  Administered 2024-05-27: 50 mg via INTRAVENOUS
  Administered 2024-05-27: 20 mg via INTRAVENOUS
  Administered 2024-05-27: 30 mg via INTRAVENOUS
  Administered 2024-05-27 (×2): 50 mg via INTRAVENOUS

## 2024-05-27 MED ORDER — POLYETHYLENE GLYCOL 3350 17 GM/SCOOP PO POWD
238.0000 g | Freq: Once | ORAL | Status: AC
  Administered 2024-05-27: 238 g via ORAL
  Filled 2024-05-27: qty 238

## 2024-05-27 MED ORDER — LIDOCAINE HCL (PF) 2 % IJ SOLN
INTRAMUSCULAR | Status: AC
  Filled 2024-05-27: qty 5

## 2024-05-27 MED ORDER — POLYETHYLENE GLYCOL 3350 17 G PO PACK
51.0000 g | PACK | ORAL | Status: DC
  Administered 2024-05-27: 51 g via ORAL
  Filled 2024-05-27: qty 3

## 2024-05-27 MED ORDER — PROPOFOL 500 MG/50ML IV EMUL
INTRAVENOUS | Status: DC | PRN
  Administered 2024-05-27: 75 ug/kg/min via INTRAVENOUS

## 2024-05-27 NOTE — Op Note (Signed)
 [facility] Gastroenterology Patient Name: Austin Wall Procedure Date: 05/27/2024 1:45 PM MRN: [id number] Account #: [id number] Date of Birth: 1956-03-08 Admit Type: Outpatient Age: 68 Room: [facility] ENDO ROOM 4 Gender: Male Note Status: Finalized Instrument Name: [id number] Procedure:             Colonoscopy Indications:           Hematochezia Providers:             Rogelia Copping MD, MD Medicines:             Propofol  per Anesthesia Complications:         No immediate complications. Procedure:             Pre-Anesthesia Assessment:                        - Prior to the procedure, a History and Physical was                         performed, and patient medications and allergies were                         reviewed. The patient's tolerance of previous                         anesthesia was also reviewed. The risks and benefits                         of the procedure and the sedation options and risks                         were discussed with the patient. All questions were                         answered, and informed consent was obtained. Prior                         Anticoagulants: The patient has taken no anticoagulant                         or antiplatelet agents. ASA Grade Assessment: II - A                         patient with mild systemic disease. After reviewing                         the risks and benefits, the patient was deemed in                         satisfactory condition to undergo the procedure.                        After obtaining informed consent, the colonoscope was                         passed under direct vision. Throughout the procedure,                         the patient's blood pressure, pulse, and oxygen  saturations were monitored continuously. The                         Colonoscope was introduced through the anus and                         advanced to the the cecum, identified by appendiceal                          orifice and ileocecal valve. The colonoscopy was                         performed without difficulty. The patient tolerated                         the procedure well. The quality of the bowel                         preparation was excellent. Findings:      The perianal and digital rectal examinations were normal.      Segmental moderate inflammation characterized by erythema and friability       was found in the sigmoid colon and in the descending colon. Biopsies       were taken with a cold forceps for histology.      A 4 mm polyp was found in the rectum. The polyp was sessile. The polyp       was removed with a cold biopsy forceps. Resection and retrieval were       complete. Impression:            - Segmental moderate inflammation was found in the                         sigmoid colon and in the descending colon secondary to                         ischemic colitis. Biopsied.                        - One 4 mm polyp in the rectum, removed with a cold                         biopsy forceps. Resected and retrieved. Recommendation:        - Return patient to hospital ward for ongoing care.                        - Resume regular diet.                        - Continue present medications.                        - Await pathology results. Procedure Code(s):     --- Professional ---                        [phone], Colonoscopy, flexible; with biopsy, single or                         multiple Diagnosis Code(s):     ---  Professional ---                        K92.1, Melena (includes Hematochezia)                        K55.9, Vascular disorder of intestine, unspecified CPT copyright 2022 American Medical Association. All rights reserved. The codes documented in this report are preliminary and upon coder review may  be revised to meet current compliance requirements. Rogelia Copping MD, MD 05/27/2024 2:33:44 PM This report has been signed electronically. Number of  Addenda: 0 Note Initiated On: 05/27/2024 1:45 PM Scope Withdrawal Time: 0 hours 4 minutes 24 seconds  Total Procedure Duration: 0 hours 15 minutes 32 seconds  Estimated Blood Loss:  Estimated blood loss: none.      [facility]

## 2024-05-27 NOTE — Transfer of Care (Signed)
 Immediate Anesthesia Transfer of Care Note  Patient: Austin Wall  Procedure(s) Performed: COLONOSCOPY POLYPECTOMY, INTESTINE  Patient Location: PACU  Anesthesia Type:General  Level of Consciousness: sedated  Airway & Oxygen Therapy: Patient Spontanous Breathing  Post-op Assessment: Report given to RN and Post -op Vital signs reviewed and stable  Post vital signs: Reviewed and stable  Last Vitals:  Vitals Value Taken Time  BP    Temp 35.7 C 05/27/24 14:34  Pulse    Resp    SpO2      Last Pain:  Vitals:   05/27/24 1434  TempSrc: Rectal  PainSc: 0-No pain         Complications: No notable events documented.

## 2024-05-27 NOTE — Plan of Care (Signed)

## 2024-05-27 NOTE — Anesthesia Postprocedure Evaluation (Signed)
 Anesthesia Post Note  Patient: Austin Wall  Procedure(s) Performed: COLONOSCOPY POLYPECTOMY, INTESTINE  Patient location during evaluation: PACU Anesthesia Type: General Level of consciousness: awake Pain management: pain level controlled Vital Signs Assessment: post-procedure vital signs reviewed and stable Respiratory status: spontaneous breathing Cardiovascular status: blood pressure returned to baseline and stable Anesthetic complications: no   No notable events documented.   Last Vitals:  Vitals:   05/27/24 1434 05/27/24 1444  BP: 109/66 95/70  Pulse: 96 93  Resp: 15 18  Temp: (!) 35.7 C   SpO2: 95% 91%    Last Pain:  Vitals:   05/27/24 1444  TempSrc:   PainSc: 0-No pain                 VAN STAVEREN,Roshana Shuffield

## 2024-05-27 NOTE — Anesthesia Preprocedure Evaluation (Signed)
 Anesthesia Evaluation  Patient identified by MRN, date of birth, ID band Patient awake    Reviewed: Allergy & Precautions, NPO status , Patient's Chart, lab work & pertinent test results  Airway Mallampati: II  TM Distance: >3 FB Neck ROM: full    Dental  (+) Teeth Intact, Caps   Pulmonary neg pulmonary ROS, Patient abstained from smoking., former smoker   Pulmonary exam normal        Cardiovascular Exercise Tolerance: Good hypertension, + CAD, + Past MI and + Cardiac Stents  negative cardio ROS Normal cardiovascular exam Rhythm:Regular Rate:Normal     Neuro/Psych  Headaches  Anxiety     negative neurological ROS  negative psych ROS   GI/Hepatic negative GI ROS, Neg liver ROS,,,  Endo/Other  negative endocrine ROS  Class 3 obesity  Renal/GU negative Renal ROS  negative genitourinary   Musculoskeletal   Abdominal  (+) + obese  Peds negative pediatric ROS (+)  Hematology negative hematology ROS (+)   Anesthesia Other Findings Past Medical History: No date: Arthritis     Comment:  left foot s/p trauma - 30 yrs ago No date: Headache     Comment:  stress No date: Hyperlipidemia No date: Hypertension No date: Insomnia No date: Prostate CA [facility])  Past Surgical History: 02/10/15: CATARACT EXTRACTION; Left     Comment:  MBSC 04/07/2015: CATARACT EXTRACTION W/PHACO; Right     Comment:  Procedure: CATARACT EXTRACTION PHACO AND INTRAOCULAR               LENS PLACEMENT (IOC);  Surgeon: Dene Etienne, MD;               Location: [facility];  Service: Ophthalmology;                Laterality: Right;  SHUGARCAINE No date: COLONOSCOPY 04/02/2020: CORONARY STENT INTERVENTION; N/A     Comment:  Procedure: CORONARY STENT INTERVENTION;  Surgeon: Mady Bruckner, MD;  Location: [facility] INVASIVE CV LAB;                Service: Cardiovascular;  Laterality: N/A; No date: EYE SURGERY 04/02/2020: LEFT  HEART CATH AND CORONARY ANGIOGRAPHY; N/A     Comment:  Procedure: LEFT HEART CATH AND CORONARY ANGIOGRAPHY;                Surgeon: Hester Wolm PARAS, MD;  Location: [facility] INVASIVE               CV LAB;  Service: Cardiovascular;  Laterality: N/A; No date: MOUTH SURGERY     Comment:  dental implants No date: radiation treatment No date: VASECTOMY  BMI    Body Mass Index: 30.73 kg/m      Reproductive/Obstetrics negative OB ROS                              Anesthesia Physical Anesthesia Plan  ASA: 3  Anesthesia Plan: General   Post-op Pain Management:    Induction: Intravenous  PONV Risk Score and Plan: Propofol  infusion and TIVA  Airway Management Planned: Natural Airway and Nasal Cannula  Additional Equipment:   Intra-op Plan:   Post-operative Plan:   Informed Consent: I have reviewed the patients History and Physical, chart, labs and discussed the procedure including the risks, benefits and alternatives for the proposed anesthesia with the patient or authorized representative who  has indicated his/her understanding and acceptance.     Dental Advisory Given  Plan Discussed with: CRNA  Anesthesia Plan Comments:         Anesthesia Quick Evaluation

## 2024-05-27 NOTE — Progress Notes (Signed)
 The patient had rectal bleeding and a colonoscopy today showed segmental colitis consistent with ischemic colitis.  The patient had biopsies taken of this.  He also had a rectal polyp that was removed with cold biopsies.  The ischemic colitis was likely the cause of bleeding and appears to be on the mend.  I would recommend supportive measures with hydration and avoiding anticoagulation for the next few days.  Nothing further to do from a GI point of view.  I will sign off.  Please call if any further GI concerns or questions.  We would like to thank you for the opportunity to participate in the care of Austin Wall.

## 2024-05-27 NOTE — Discharge Summary (Signed)
 Physician Discharge Summary   Austin Wall  male DOB: 02/17/1956  FMW:[id number]  PCP: Alla Amis, MD  Admit date: 05/26/2024 Discharge date: 05/27/2024  Admitted From: home Disposition:  home CODE STATUS: Full code   Hospital Course:  For full details, please see H&P, progress notes, consult notes and ancillary notes.  Briefly,  Austin Wall is a 68 year old male with history of hypertension, hyperlipidemia, CAD who presented emergency department for chief concerns of bright red blood per rectum.   * Lower GI bleed 2/2 Ischemic colitis --colonoscopy showed segmental colitis consistent with ischemic colitis.  --rectal bleeding resolved prior to discharge.  Hgb 13.2 prior to discharge.    CAD S/P percutaneous coronary angioplasty Status post DES PCI to proximal/mid LAD in May 2021 Patient currently on aspirin  81 mg daily, not taking Brilinta  PTA. --resume home ASA on 7/26. --cont home statin.  Anxiety --cont Home buspirone  5 mg p.o. twice daily, paroxetine  30 mg nightly    AKI --CR 1.58 on presentation, improved to 1.27 prior to discharge.   Hypertension --cont Home amlodipine  5 mg daily  --resume home benazepril  after discharge.   Hyperlipidemia --cont Home atorvastatin  80 mg    Unless noted above, medications under STOP list are ones pt was not taking PTA.  Discharge Diagnoses:  Principal Problem:   Lower GI bleed Active Problems:   Hyperlipidemia   Hypertension   Insomnia   Arthritis   Anxiety   Unstable angina (HCC)   CAD S/P percutaneous coronary angioplasty   Colitis     Discharge Instructions:  Allergies as of 05/27/2024       Reactions   Codeine Nausea Only        Medication List     PAUSE taking these medications    aspirin  EC 81 MG tablet Wait to take this until: May 31, 2024 Take 1 tablet (81 mg total) by mouth daily.       STOP taking these medications    nitroGLYCERIN  0.4 MG SL tablet Commonly  known as: NITROSTAT    ticagrelor  90 MG Tabs tablet Commonly known as: BRILINTA        TAKE these medications    amLODipine  5 MG tablet Commonly known as: NORVASC  Take 5 mg by mouth daily.   atorvastatin  80 MG tablet Commonly known as: LIPITOR  Take 80 mg by mouth.  TAKE 1 TABLET (80 MG TOTAL) BY MOUTH ONCE DAILY.   benazepril  40 MG tablet Commonly known as: LOTENSIN  Take 1 tablet (40 mg total) by mouth daily.   busPIRone  5 MG tablet Commonly known as: BUSPAR  Take 5 mg by mouth 2 (two) times daily.   ezetimibe  10 MG tablet Commonly known as: ZETIA  Take 10 mg by mouth daily.   multivitamin capsule Take 1 capsule by mouth daily. AM   PARoxetine  30 MG tablet Commonly known as: PAXIL  Take 30 mg by mouth at bedtime.         Follow-up Information     Jinny Carmine, MD Follow up.   Specialty: Gastroenterology Why: As needed Contact information: [address] [place]  [place] [address] [phone]         Alla Amis, MD Follow up in 1 week(s).   Specialty: Family Medicine Contact information: [address] [facility] [place] [address] [phone]                 Allergies  Allergen Reactions   Codeine Nausea Only     The results of significant diagnostics  from this hospitalization (including imaging, microbiology, ancillary and laboratory) are listed below for reference.   Consultations:   Procedures/Studies: CT ANGIO GI BLEED Result Date: 05/26/2024 CLINICAL DATA:  Diverticulitis.  Concern for active GI bleeding. EXAM: CTA ABDOMEN AND PELVIS WITHOUT AND WITH CONTRAST TECHNIQUE: Multidetector CT imaging of the abdomen and pelvis was performed using the standard protocol during bolus administration of intravenous contrast. Multiplanar reconstructed images and MIPs were obtained and reviewed to evaluate the vascular anatomy. RADIATION DOSE REDUCTION: This exam was performed according to the departmental  dose-optimization program which includes automated exposure control, adjustment of the mA and/or kV according to patient size and/or use of iterative reconstruction technique. CONTRAST:  OMNIPAQUE  IOHEXOL  350 MG/ML SOLN COMPARISON:  None Available. FINDINGS: VASCULAR Aorta: Normal caliber aorta without aneurysm, dissection, vasculitis or significant stenosis. Celiac: Patent without evidence of aneurysm, dissection, vasculitis or significant stenosis. SMA: Patent without evidence of aneurysm, dissection, vasculitis or significant stenosis. Renals: Both renal arteries are patent without evidence of aneurysm, dissection, vasculitis, fibromuscular dysplasia or significant stenosis. IMA: Patent without evidence of aneurysm, dissection, vasculitis or significant stenosis. Inflow: Patent without evidence of aneurysm, dissection, vasculitis or significant stenosis. Veins: No obvious venous abnormality within the limitations of this arterial phase study. Review of the MIP images confirms the above findings. NON-VASCULAR Lower chest: Lung bases are clear. Hepatobiliary: No focal hepatic lesion. Normal gallbladder. No biliary duct dilatation. Common bile duct is normal. Pancreas: Pancreas is normal. No ductal dilatation. No pancreatic inflammation. Spleen: Normal spleen Adrenals/urinary tract: Adrenal glands and kidneys are normal. The ureters and bladder normal. Stomach/Bowel: No evidence of active bleeding into the stomach duodenum or small bowel. Small amount of high-density stool is present in the cecum on noncontrast imaging. No evidence of active bleeding into the RIGHT colon or LEFT colon. No evidence of rectosigmoid colon bleeding on heart arterial phase imaging. On venous phase imaging, no pooling of contrast in the GI tract. No evidence of acute diverticulitis. Minimal diverticular disease. There is mild inflammation associated with the distal descending colon and proximal sigmoid colon with subtle haziness in  the adjacent mesenteric fat (images 151 through 187 of series 9). Vascular/Lymphatic: Abdominal aorta is normal caliber with atherosclerotic calcification. There is no retroperitoneal or periportal lymphadenopathy. No pelvic lymphadenopathy. Reproductive: Prostate unremarkable Other: No free fluid. Musculoskeletal: No aggressive osseous lesion. IMPRESSION: 1. No evidence of active GI bleeding. 2. No evidence of acute diverticulitis. 3. Mild inflammation associated with the distal descending colon and proximal sigmoid colon. Mild segmental colitis. 4.  Aortic Atherosclerosis (ICD10-I70.0). Electronically Signed   By: Jackquline Boxer M.D.   On: 05/26/2024 19:51      Labs: BNP (last 3 results) No results for input(s): BNP in the last 8760 hours. Basic Metabolic Panel: Recent Labs  Lab 05/26/24 1726 05/27/24 0316  NA 137 139  K 4.5 4.1  CL 101 102  CO2 24 25  GLUCOSE 159* 143*  BUN 30* 30*  CREATININE 1.58* 1.27*  CALCIUM  11.0* 10.4*   Liver Function Tests: Recent Labs  Lab 05/26/24 1726  AST 37  ALT 38  ALKPHOS 69  BILITOT 0.9  PROT 7.7  ALBUMIN 4.3   No results for input(s): LIPASE, AMYLASE in the last 168 hours. No results for input(s): AMMONIA in the last 168 hours. CBC: Recent Labs  Lab 05/26/24 1726 05/27/24 0316  WBC 18.5* 15.6*  NEUTROABS 15.2*  --   HGB 14.9 13.2  HCT 46.3 39.7  MCV 91.5 91.5  PLT  275 216   Cardiac Enzymes: No results for input(s): CKTOTAL, CKMB, CKMBINDEX, TROPONINI in the last 168 hours. BNP: Invalid input(s): POCBNP CBG: No results for input(s): GLUCAP in the last 168 hours. D-Dimer No results for input(s): DDIMER in the last 72 hours. Hgb A1c No results for input(s): HGBA1C in the last 72 hours. Lipid Profile No results for input(s): CHOL, HDL, LDLCALC, TRIG, CHOLHDL, LDLDIRECT in the last 72 hours. Thyroid function studies No results for input(s): TSH, T4TOTAL, T3FREE, THYROIDAB in the  last 72 hours.  Invalid input(s): FREET3 Anemia work up No results for input(s): VITAMINB12, FOLATE, FERRITIN, TIBC, IRON, RETICCTPCT in the last 72 hours. Urinalysis    Component Value Date/Time   COLORURINE YELLOW (A) 05/27/2024 0500   APPEARANCEUR CLEAR (A) 05/27/2024 0500   APPEARANCEUR Clear 07/10/2019 0848   LABSPEC >1.046 (H) 05/27/2024 0500   PHURINE 5.0 05/27/2024 0500   GLUCOSEU NEGATIVE 05/27/2024 0500   HGBUR NEGATIVE 05/27/2024 0500   BILIRUBINUR NEGATIVE 05/27/2024 0500   BILIRUBINUR Negative 07/10/2019 0848   KETONESUR NEGATIVE 05/27/2024 0500   PROTEINUR NEGATIVE 05/27/2024 0500   NITRITE NEGATIVE 05/27/2024 0500   LEUKOCYTESUR NEGATIVE 05/27/2024 0500   Sepsis Labs Recent Labs  Lab 05/26/24 1726 05/27/24 0316  WBC 18.5* 15.6*   Microbiology No results found for this or any previous visit (from the past 240 hours).   Total time spend on discharging this patient, including the last patient exam, discussing the hospital stay, instructions for ongoing care as it relates to all pertinent caregivers, as well as preparing the medical discharge records, prescriptions, and/or referrals as applicable, is 35 minutes.    Ellouise Haber, MD  [facility] 05/27/2024, 2:44 PM

## 2024-05-27 NOTE — Care Management Obs Status (Signed)
 MEDICARE OBSERVATION STATUS NOTIFICATION   Patient Details  Name: Austin Wall MRN: [id number] Date of Birth: 03-01-1956   Medicare Observation Status Notification Given:  Yes    Raylee Strehl W, CMA 05/27/2024, 9:52 AM

## 2024-05-27 NOTE — Consult Note (Signed)
 Austin Copping, MD [facility]  [address] [place], [place] [address] Phone: [phone] Fax : [phone]  Consultation  Referring Provider:     Dr. Sherre Primary Care Physician:  Alla Amis, MD Primary Gastroenterologist:          [facility] Reason for Consultation:     Lower GI bleed  Date of Admission:  05/26/2024 Date of Consultation:  05/27/2024         HPI:   Austin Wall is a 68 y.o. male who came to the hospital yesterday evening after reporting that he started to have bright red blood per rectum that started at midnight the night prior.  The patient reports that his last colonoscopy was in 2015.  The patient has had multiple further bouts of rectal bleeding with bright red blood per rectum this morning.  The patient denies any alcohol abuse but does state that he takes NSAIDs intermittently with his last dose approximately a week ago.  The patient had a CT angiography that showed:  IMPRESSION: 1. No evidence of active GI bleeding. 2. No evidence of acute diverticulitis. 3. Mild inflammation associated with the distal descending colon and proximal sigmoid colon. Mild segmental colitis. 4.  Aortic Atherosclerosis  The patient's blood count has also remained in the normal level but did drop since admission with his blood count showing:  Component     Latest Ref Rng 04/08/2020 05/26/2024 05/27/2024  Hemoglobin     13.0 - 17.0 g/dL 84.6  85.0  86.7   HCT     39.0 - 52.0 % 46.1  46.3  39.7    He denies any abdominal pain.  He does have a history of hypertension hyperlipidemia, diabetes and coronary artery disease with an NSTEMI with stenting of the LAD in 2021.  The patient was reported to be taking Brilinta .  The patient also reports that he had rectal bleeding many years ago and he was told that he had colitis at that time.  Past Medical History:  Diagnosis Date   Arthritis    left foot s/p trauma - 30 yrs ago   Headache    stress    Hyperlipidemia    Hypertension    Insomnia    Prostate CA [facility])     Past Surgical History:  Procedure Laterality Date   CATARACT EXTRACTION Left 02/10/15   [facility]   CATARACT EXTRACTION W/PHACO Right 04/07/2015   Procedure: CATARACT EXTRACTION PHACO AND INTRAOCULAR LENS PLACEMENT (IOC);  Surgeon: Dene Etienne, MD;  Location: [facility];  Service: Ophthalmology;  Laterality: Right;  SHUGARCAINE   COLONOSCOPY     CORONARY STENT INTERVENTION N/A 04/02/2020   Procedure: CORONARY STENT INTERVENTION;  Surgeon: Mady Bruckner, MD;  Location: [facility];  Service: Cardiovascular;  Laterality: N/A;   EYE SURGERY     LEFT HEART CATH AND CORONARY ANGIOGRAPHY N/A 04/02/2020   Procedure: LEFT HEART CATH AND CORONARY ANGIOGRAPHY;  Surgeon: Hester Wolm PARAS, MD;  Location: [facility] INVASIVE CV LAB;  Service: Cardiovascular;  Laterality: N/A;   MOUTH SURGERY     dental implants   radiation treatment     VASECTOMY      Prior to Admission medications   Medication Sig Start Date End Date Taking? Authorizing Provider  amLODipine  (NORVASC ) 5 MG tablet Take 5 mg by mouth daily.   Yes [provider]  aspirin  EC 81 MG EC tablet Take 1 tablet (81 mg total) by mouth  daily. 04/04/20  Yes Milissa Tod PARAS, MD  atorvastatin  (LIPITOR ) 80 MG tablet Take 80 mg by mouth.  TAKE 1 TABLET (80 MG TOTAL) BY MOUTH ONCE DAILY. 05/26/24 05/26/25 Yes [provider]  benazepril  (LOTENSIN ) 40 MG tablet Take 1 tablet (40 mg total) by mouth daily. 04/04/20  Yes Milissa Tod PARAS, MD  busPIRone  (BUSPAR ) 5 MG tablet Take 5 mg by mouth 2 (two) times daily. 12/13/23  Yes [provider]  ezetimibe  (ZETIA ) 10 MG tablet Take 10 mg by mouth daily.   Yes [provider]  PARoxetine  (PAXIL ) 30 MG tablet Take 30 mg by mouth at bedtime.   Yes [provider]  Multiple Vitamin (MULTIVITAMIN) capsule Take 1 capsule by mouth daily. AM    [provider]  nitroGLYCERIN  (NITROSTAT )  0.4 MG SL tablet Place 1 tablet (0.4 mg total) under the tongue every 5 (five) minutes x 3 doses as needed for chest pain. Patient not taking: Reported on 05/26/2024 04/03/20   Milissa Tod PARAS, MD  ticagrelor  (BRILINTA ) 90 MG TABS tablet Take 1 tablet (90 mg total) by mouth 2 (two) times daily. Patient not taking: Reported on 05/26/2024 04/03/20   Milissa Tod PARAS, MD    Family History  Problem Relation Age of Onset   Cancer Mother    Hypertension Mother    Hypertension Father      Social History   Tobacco Use   Smoking status: Former    Current packs/day: 0.00    Types: Cigarettes    Quit date: 06/24/1995    Years since quitting: 28.9   Smokeless tobacco: Never  Vaping Use   Vaping status: Never Used  Substance Use Topics   Alcohol use: Yes    Alcohol/week: 0.0 standard drinks of alcohol    Comment: very little   Drug use: No    Types: Marijuana    Allergies as of 05/26/2024 - Review Complete 05/26/2024  Allergen Reaction Noted   Codeine Nausea Only 03/30/2015    Review of Systems:    All systems reviewed and negative except where noted in HPI.   Physical Exam:  Vital signs in last 24 hours: Temp:  [98 F (36.7 C)-98.5 F (36.9 C)] 98.2 F (36.8 C) (07/22 0757) Pulse Rate:  [80-110] 81 (07/22 0757) Resp:  [16-20] 17 (07/22 0757) BP: (124-155)/(55-118) 139/77 (07/22 0757) SpO2:  [92 %-98 %] 94 % (07/22 0757) Last BM Date : 05/27/24 General:   Pleasant, cooperative in NAD Head:  Normocephalic and atraumatic. Eyes:   No icterus.   Conjunctiva pink. PERRLA. Ears:  Normal auditory acuity. Neck:  Supple; no masses or thyroidomegaly Rectal:  Not performed. Msk:  Symmetrical without gross deformities.    Neurologic:  Alert and oriented x3;  grossly normal neurologically. Skin:  Intact without significant lesions or rashes. Cervical Nodes:  No significant cervical adenopathy. Psych:  Alert and cooperative. Normal affect.  LAB RESULTS: Recent Labs    05/26/24 1726  05/27/24 0316  WBC 18.5* 15.6*  HGB 14.9 13.2  HCT 46.3 39.7  PLT 275 216   BMET Recent Labs    05/26/24 1726 05/27/24 0316  NA 137 139  K 4.5 4.1  CL 101 102  CO2 24 25  GLUCOSE 159* 143*  BUN 30* 30*  CREATININE 1.58* 1.27*  CALCIUM  11.0* 10.4*   LFT Recent Labs    05/26/24 1726  PROT 7.7  ALBUMIN 4.3  AST 37  ALT 38  ALKPHOS 69  BILITOT 0.9  PT/INR No results for input(s): LABPROT, INR in the last 72 hours.  STUDIES: CT ANGIO GI BLEED Result Date: 05/26/2024 CLINICAL DATA:  Diverticulitis.  Concern for active GI bleeding. EXAM: CTA ABDOMEN AND PELVIS WITHOUT AND WITH CONTRAST TECHNIQUE: Multidetector CT imaging of the abdomen and pelvis was performed using the standard protocol during bolus administration of intravenous contrast. Multiplanar reconstructed images and MIPs were obtained and reviewed to evaluate the vascular anatomy. RADIATION DOSE REDUCTION: This exam was performed according to the departmental dose-optimization program which includes automated exposure control, adjustment of the mA and/or kV according to patient size and/or use of iterative reconstruction technique. CONTRAST:  OMNIPAQUE  IOHEXOL  350 MG/ML SOLN COMPARISON:  None Available. FINDINGS: VASCULAR Aorta: Normal caliber aorta without aneurysm, dissection, vasculitis or significant stenosis. Celiac: Patent without evidence of aneurysm, dissection, vasculitis or significant stenosis. SMA: Patent without evidence of aneurysm, dissection, vasculitis or significant stenosis. Renals: Both renal arteries are patent without evidence of aneurysm, dissection, vasculitis, fibromuscular dysplasia or significant stenosis. IMA: Patent without evidence of aneurysm, dissection, vasculitis or significant stenosis. Inflow: Patent without evidence of aneurysm, dissection, vasculitis or significant stenosis. Veins: No obvious venous abnormality within the limitations of this arterial phase study. Review of the  MIP images confirms the above findings. NON-VASCULAR Lower chest: Lung bases are clear. Hepatobiliary: No focal hepatic lesion. Normal gallbladder. No biliary duct dilatation. Common bile duct is normal. Pancreas: Pancreas is normal. No ductal dilatation. No pancreatic inflammation. Spleen: Normal spleen Adrenals/urinary tract: Adrenal glands and kidneys are normal. The ureters and bladder normal. Stomach/Bowel: No evidence of active bleeding into the stomach duodenum or small bowel. Small amount of high-density stool is present in the cecum on noncontrast imaging. No evidence of active bleeding into the RIGHT colon or LEFT colon. No evidence of rectosigmoid colon bleeding on heart arterial phase imaging. On venous phase imaging, no pooling of contrast in the GI tract. No evidence of acute diverticulitis. Minimal diverticular disease. There is mild inflammation associated with the distal descending colon and proximal sigmoid colon with subtle haziness in the adjacent mesenteric fat (images 151 through 187 of series 9). Vascular/Lymphatic: Abdominal aorta is normal caliber with atherosclerotic calcification. There is no retroperitoneal or periportal lymphadenopathy. No pelvic lymphadenopathy. Reproductive: Prostate unremarkable Other: No free fluid. Musculoskeletal: No aggressive osseous lesion. IMPRESSION: 1. No evidence of active GI bleeding. 2. No evidence of acute diverticulitis. 3. Mild inflammation associated with the distal descending colon and proximal sigmoid colon. Mild segmental colitis. 4.  Aortic Atherosclerosis (ICD10-I70.0). Electronically Signed   By: Jackquline Boxer M.D.   On: 05/26/2024 19:51      Impression / Plan:   Assessment: Principal Problem:   Lower GI bleed Active Problems:   Hyperlipidemia   Hypertension   Insomnia   Arthritis   Anxiety   Unstable angina (HCC)   CAD S/P percutaneous coronary angioplasty   KAYMAN SNUFFER is a 68 y.o. y/o male with rectal bleeding with a  history of being on anticoagulation for coronary artery disease.  The patient reported that he had been feeling lightheaded for some time even before the bleeding started.  He continues to have a normal hemoglobin although his hemoglobin has dropped.  The patient has been given a choice to send a clear liquid diet today and take a prep later today for colonoscopy tomorrow or try to take the prep this morning for colonoscopy late this afternoon.  Plan:  The patient states that he would like to try and take  the prep today and plan for colonoscopy later today.  I have put in the orders for the patient to have a prep and will be set up for today if he is able to get the prep and finish it in the allotted time.  The patient has been explained the plan and agrees with it.  Thank you for involving me in the care of this patient.      LOS: 0 days   Austin Copping, MD, MD. NOLIA 05/27/2024, 8:23 AM,  Pager [phone] 7am-5pm  Check AMION for 5pm -7am coverage and on weekends   Note: This dictation was prepared with Dragon dictation along with smaller phrase technology. Any transcriptional errors that result from this process are unintentional.

## 2024-05-28 ENCOUNTER — Ambulatory Visit: Payer: Self-pay | Admitting: Gastroenterology

## 2024-05-28 ENCOUNTER — Encounter: Payer: Self-pay | Admitting: Gastroenterology

## 2024-05-28 LAB — SURGICAL PATHOLOGY

## 2024-05-31 LAB — CULTURE, BLOOD (ROUTINE X 2)
Culture: NO GROWTH
Culture: NO GROWTH
